# Patient Record
Sex: Female | Born: 1950 | Race: White | Hispanic: No | Marital: Married | Smoking: Former smoker
Health system: Southern US, Community
[De-identification: ages and names within clinical notes are randomized; demographics above are authoritative.]

## PROBLEM LIST (undated history)

## (undated) DIAGNOSIS — E119 Type 2 diabetes mellitus without complications: Secondary | ICD-10-CM

## (undated) DIAGNOSIS — I7300 Raynaud's syndrome without gangrene: Secondary | ICD-10-CM

## (undated) DIAGNOSIS — K589 Irritable bowel syndrome without diarrhea: Secondary | ICD-10-CM

## (undated) DIAGNOSIS — R112 Nausea with vomiting, unspecified: Secondary | ICD-10-CM

## (undated) DIAGNOSIS — G8929 Other chronic pain: Secondary | ICD-10-CM

## (undated) DIAGNOSIS — M1990 Unspecified osteoarthritis, unspecified site: Secondary | ICD-10-CM

## (undated) DIAGNOSIS — M549 Dorsalgia, unspecified: Secondary | ICD-10-CM

## (undated) DIAGNOSIS — I10 Essential (primary) hypertension: Secondary | ICD-10-CM

## (undated) DIAGNOSIS — M797 Fibromyalgia: Secondary | ICD-10-CM

## (undated) DIAGNOSIS — Z9889 Other specified postprocedural states: Secondary | ICD-10-CM

## (undated) DIAGNOSIS — T8859XA Other complications of anesthesia, initial encounter: Secondary | ICD-10-CM

## (undated) DIAGNOSIS — IMO0002 Reserved for concepts with insufficient information to code with codable children: Secondary | ICD-10-CM

## (undated) DIAGNOSIS — K5792 Diverticulitis of intestine, part unspecified, without perforation or abscess without bleeding: Secondary | ICD-10-CM

## (undated) DIAGNOSIS — R41840 Attention and concentration deficit: Secondary | ICD-10-CM

## (undated) DIAGNOSIS — T4145XA Adverse effect of unspecified anesthetic, initial encounter: Secondary | ICD-10-CM

## (undated) HISTORY — DX: Dorsalgia, unspecified: M54.9

## (undated) HISTORY — DX: Type 2 diabetes mellitus without complications: E11.9

## (undated) HISTORY — PX: TUBAL LIGATION: SHX77

## (undated) HISTORY — DX: Attention and concentration deficit: R41.840

## (undated) HISTORY — DX: Other chronic pain: G89.29

## (undated) HISTORY — DX: Diverticulitis of intestine, part unspecified, without perforation or abscess without bleeding: K57.92

## (undated) HISTORY — DX: Irritable bowel syndrome, unspecified: K58.9

## (undated) HISTORY — DX: Raynaud's syndrome without gangrene: I73.00

---

## 1999-11-20 ENCOUNTER — Other Ambulatory Visit: Admission: RE | Admit: 1999-11-20 | Discharge: 1999-11-20 | Payer: Self-pay | Admitting: Obstetrics and Gynecology

## 2000-01-06 HISTORY — PX: OOPHORECTOMY: SHX86

## 2001-06-10 ENCOUNTER — Emergency Department (HOSPITAL_COMMUNITY): Admission: EM | Admit: 2001-06-10 | Discharge: 2001-06-10 | Payer: Self-pay | Admitting: *Deleted

## 2003-05-16 ENCOUNTER — Ambulatory Visit (HOSPITAL_COMMUNITY): Admission: RE | Admit: 2003-05-16 | Discharge: 2003-05-16 | Payer: Self-pay | Admitting: Internal Medicine

## 2003-07-13 ENCOUNTER — Ambulatory Visit (HOSPITAL_COMMUNITY): Admission: RE | Admit: 2003-07-13 | Discharge: 2003-07-13 | Payer: Self-pay | Admitting: Family Medicine

## 2005-04-17 ENCOUNTER — Ambulatory Visit: Payer: Self-pay | Admitting: Internal Medicine

## 2006-01-19 ENCOUNTER — Encounter: Payer: Self-pay | Admitting: Internal Medicine

## 2006-01-19 DIAGNOSIS — Z860100 Personal history of colon polyps, unspecified: Secondary | ICD-10-CM | POA: Insufficient documentation

## 2006-01-19 DIAGNOSIS — Z8719 Personal history of other diseases of the digestive system: Secondary | ICD-10-CM | POA: Insufficient documentation

## 2006-01-19 DIAGNOSIS — K589 Irritable bowel syndrome without diarrhea: Secondary | ICD-10-CM | POA: Insufficient documentation

## 2006-01-19 DIAGNOSIS — I10 Essential (primary) hypertension: Secondary | ICD-10-CM | POA: Insufficient documentation

## 2006-01-19 DIAGNOSIS — M1990 Unspecified osteoarthritis, unspecified site: Secondary | ICD-10-CM | POA: Insufficient documentation

## 2006-01-19 DIAGNOSIS — F988 Other specified behavioral and emotional disorders with onset usually occurring in childhood and adolescence: Secondary | ICD-10-CM | POA: Insufficient documentation

## 2006-01-19 DIAGNOSIS — G43909 Migraine, unspecified, not intractable, without status migrainosus: Secondary | ICD-10-CM | POA: Insufficient documentation

## 2006-10-12 ENCOUNTER — Encounter (INDEPENDENT_AMBULATORY_CARE_PROVIDER_SITE_OTHER): Payer: Self-pay | Admitting: Internal Medicine

## 2008-10-29 ENCOUNTER — Encounter (INDEPENDENT_AMBULATORY_CARE_PROVIDER_SITE_OTHER): Payer: Self-pay | Admitting: Internal Medicine

## 2010-01-26 ENCOUNTER — Encounter: Payer: Self-pay | Admitting: Obstetrics and Gynecology

## 2010-01-26 ENCOUNTER — Encounter: Payer: Self-pay | Admitting: Family Medicine

## 2010-01-26 ENCOUNTER — Encounter: Payer: Self-pay | Admitting: Internal Medicine

## 2011-02-19 DIAGNOSIS — R0602 Shortness of breath: Secondary | ICD-10-CM | POA: Insufficient documentation

## 2011-02-19 DIAGNOSIS — E119 Type 2 diabetes mellitus without complications: Secondary | ICD-10-CM | POA: Insufficient documentation

## 2011-02-19 DIAGNOSIS — R002 Palpitations: Secondary | ICD-10-CM | POA: Insufficient documentation

## 2011-02-19 DIAGNOSIS — R079 Chest pain, unspecified: Secondary | ICD-10-CM | POA: Insufficient documentation

## 2011-02-19 DIAGNOSIS — R5383 Other fatigue: Secondary | ICD-10-CM | POA: Insufficient documentation

## 2011-03-03 ENCOUNTER — Other Ambulatory Visit (HOSPITAL_COMMUNITY): Payer: Self-pay | Admitting: Internal Medicine

## 2011-03-03 DIAGNOSIS — R109 Unspecified abdominal pain: Secondary | ICD-10-CM

## 2011-03-04 ENCOUNTER — Ambulatory Visit (HOSPITAL_COMMUNITY)
Admission: RE | Admit: 2011-03-04 | Discharge: 2011-03-04 | Disposition: A | Discharge status: Home / Self Care | Payer: Medicare Other | Source: Ambulatory Visit | Attending: Internal Medicine | Admitting: Internal Medicine

## 2011-03-04 DIAGNOSIS — R109 Unspecified abdominal pain: Secondary | ICD-10-CM | POA: Insufficient documentation

## 2011-03-06 ENCOUNTER — Encounter (HOSPITAL_COMMUNITY): Payer: Self-pay | Admitting: *Deleted

## 2011-03-06 ENCOUNTER — Emergency Department (HOSPITAL_COMMUNITY): Payer: Medicare Other

## 2011-03-06 ENCOUNTER — Emergency Department (HOSPITAL_COMMUNITY)
Admission: EM | Admit: 2011-03-06 | Discharge: 2011-03-06 | Disposition: A | Discharge status: Home / Self Care | Payer: Medicare Other | Attending: Emergency Medicine | Admitting: Emergency Medicine

## 2011-03-06 DIAGNOSIS — R634 Abnormal weight loss: Secondary | ICD-10-CM | POA: Insufficient documentation

## 2011-03-06 DIAGNOSIS — H53149 Visual discomfort, unspecified: Secondary | ICD-10-CM | POA: Insufficient documentation

## 2011-03-06 DIAGNOSIS — R5383 Other fatigue: Secondary | ICD-10-CM

## 2011-03-06 DIAGNOSIS — R5381 Other malaise: Secondary | ICD-10-CM | POA: Insufficient documentation

## 2011-03-06 DIAGNOSIS — I10 Essential (primary) hypertension: Secondary | ICD-10-CM | POA: Insufficient documentation

## 2011-03-06 DIAGNOSIS — Z79899 Other long term (current) drug therapy: Secondary | ICD-10-CM | POA: Insufficient documentation

## 2011-03-06 DIAGNOSIS — M810 Age-related osteoporosis without current pathological fracture: Secondary | ICD-10-CM | POA: Insufficient documentation

## 2011-03-06 DIAGNOSIS — Z7982 Long term (current) use of aspirin: Secondary | ICD-10-CM | POA: Insufficient documentation

## 2011-03-06 DIAGNOSIS — R51 Headache: Secondary | ICD-10-CM | POA: Insufficient documentation

## 2011-03-06 DIAGNOSIS — M129 Arthropathy, unspecified: Secondary | ICD-10-CM | POA: Insufficient documentation

## 2011-03-06 DIAGNOSIS — R630 Anorexia: Secondary | ICD-10-CM | POA: Insufficient documentation

## 2011-03-06 DIAGNOSIS — R110 Nausea: Secondary | ICD-10-CM | POA: Insufficient documentation

## 2011-03-06 HISTORY — DX: Unspecified osteoarthritis, unspecified site: M19.90

## 2011-03-06 HISTORY — DX: Reserved for concepts with insufficient information to code with codable children: IMO0002

## 2011-03-06 HISTORY — DX: Fibromyalgia: M79.7

## 2011-03-06 HISTORY — DX: Essential (primary) hypertension: I10

## 2011-03-06 LAB — BLOOD GAS, ARTERIAL
Acid-Base Excess: 1.2 mmol/L (ref 0.0–2.0)
TCO2: 21.6 mmol/L (ref 0–100)
pCO2 arterial: 36.3 mmHg (ref 35.0–45.0)
pO2, Arterial: 87.9 mmHg (ref 80.0–100.0)

## 2011-03-06 LAB — COMPREHENSIVE METABOLIC PANEL
Albumin: 3.9 g/dL (ref 3.5–5.2)
Alkaline Phosphatase: 87 U/L (ref 39–117)
BUN: 12 mg/dL (ref 6–23)
Chloride: 100 mEq/L (ref 96–112)
GFR calc Af Amer: >90 mL/min (ref >90)
Glucose, Bld: 141 mg/dL — ABNORMAL HIGH (ref 70–99)
Potassium: 3.8 mEq/L (ref 3.5–5.1)
Total Bilirubin: 0.8 mg/dL (ref 0.3–1.2)

## 2011-03-06 LAB — DIFFERENTIAL
Lymphs Abs: 1.4 10*3/uL (ref 0.7–4.0)
Monocytes Relative: 8 % (ref 3–12)
Neutro Abs: 4.7 10*3/uL (ref 1.7–7.7)
Neutrophils Relative %: 70 % (ref 43–77)

## 2011-03-06 LAB — URINALYSIS, ROUTINE W REFLEX MICROSCOPIC
Bilirubin Urine: NEGATIVE
Glucose, UA: NEGATIVE mg/dL
Ketones, ur: NEGATIVE mg/dL
Leukocytes, UA: NEGATIVE
Protein, ur: NEGATIVE mg/dL

## 2011-03-06 LAB — CBC
Hemoglobin: 14.3 g/dL (ref 12.0–15.0)
RBC: 4.46 MIL/uL (ref 3.87–5.11)
WBC: 6.7 10*3/uL (ref 4.0–10.5)

## 2011-03-06 LAB — D-DIMER, QUANTITATIVE: D-Dimer, Quant: 0.33 ug/mL-FEU (ref 0.00–0.48)

## 2011-03-06 LAB — TROPONIN I: Troponin I: <0.3 ng/mL (ref <0.30)

## 2011-03-06 MED ORDER — SODIUM CHLORIDE 0.9 % IV SOLN: INTRAVENOUS | Status: DC

## 2011-03-06 MED ORDER — SODIUM CHLORIDE 0.9 % IV BOLUS (SEPSIS)
1000.0000 mL | Freq: Once | INTRAVENOUS | Status: AC
  Administered 2011-03-06: 1000 mL via INTRAVENOUS

## 2011-03-06 MED ORDER — ONDANSETRON HCL 4 MG/2ML IJ SOLN
4.0000 mg | Freq: Once | INTRAMUSCULAR | Status: AC
  Administered 2011-03-06: 4 mg via INTRAVENOUS
  Filled 2011-03-06: qty 2

## 2011-03-06 NOTE — ED Notes (Signed)
Pt brought to er by husband to eval nausea that she has been having for months.  Pt also had shallow breathing yesterday, husband reported while he was a work.  Called pmd today to notify about symptoms she was having yesterday and was told to come to er for eval.  Pt stated the her breathing is better today.  Also loosing weight over the last few months.   Pt in dark room, with washcloth over face/eyes

## 2011-03-06 NOTE — ED Notes (Signed)
md to see pt, orders obtained.

## 2011-03-06 NOTE — ED Notes (Signed)
Nausea, unable  To eat,  Being evaluated by Dr Felecia Shelling for possible MS or Parkinsons.  Husband says shallow breathing and feels she is stopping breathing at times. Talking at triage.

## 2011-03-06 NOTE — ED Provider Notes (Addendum)
History     CSN: [id number]  Arrival date & time 03/06/11  1154   First MD Initiated Contact with Patient 03/06/11 1311      Chief Complaint  Patient presents with  . Nausea    (Consider location/radiation/quality/duration/timing/severity/associated sxs/prior treatment) Patient is a 61 y.o. female presenting with weakness. The history is provided by the patient, the spouse and medical records.  Weakness The primary symptoms include headaches and nausea. Primary symptoms do not include syncope, loss of consciousness, altered mental status, seizures, dizziness, visual change, paresthesias, focal weakness, loss of sensation, speech change, memory loss, fever or vomiting. Primary symptoms comment: loss of appetite resulting in decreased intake of food over the last 5-6 weeks with subsequent loss of weight to approximately 15 pounds in the last 2 weeks. The patient has a very flat affect and reports a significant lack of energy.  The symptoms began more than 1 week ago (7 weeks ago). The symptoms are unchanged. The neurological symptoms are diffuse.  The headache began more than 2 days ago. The headache developed gradually. Headache is a recurrent problem. The headache is present frequently. The pain from the headache is at a severity of 4/10. Location/region(s) of the headache: bilateral and frontal. Associated with: nothing. The headache is associated with photophobia and weakness. The headache is not associated with aura, eye pain, visual change, neck stiffness, paresthesias or loss of balance.  Additional symptoms include weakness, photophobia and dysphoric mood. Additional symptoms do not include neck stiffness, loss of balance, aura, hallucinations, hearing loss or tinnitus.    Past Medical History  Diagnosis Date  . Hypertension   . Arthritis   . Osteoporosis   . Fibromyalgia   . Disc degeneration     Past Surgical History  Procedure Date  . Oophorectomy     History reviewed. No  pertinent family history.  History  Substance Use Topics  . Smoking status: Current Some Day Smoker  . Smokeless tobacco: Not on file  . Alcohol Use: No    OB History    Grav Para Term Preterm Abortions TAB SAB Ect Mult Living                  Review of Systems  Constitutional: Positive for chills, activity change, appetite change, fatigue and unexpected weight change. Negative for fever and diaphoresis.  HENT: Negative for hearing loss, ear pain, nosebleeds, congestion, sore throat, facial swelling, rhinorrhea, trouble swallowing, neck pain, neck stiffness, tinnitus and ear discharge.   Eyes: Positive for photophobia. Negative for pain, discharge, redness, itching and visual disturbance.  Respiratory: Positive for shortness of breath. Negative for cough, chest tightness, wheezing and stridor.   Cardiovascular: Negative for chest pain, palpitations, leg swelling and syncope.  Gastrointestinal: Positive for nausea. Negative for vomiting, abdominal pain, diarrhea, blood in stool and abdominal distention.  Genitourinary: Negative for dysuria, frequency, hematuria, flank pain, decreased urine volume and difficulty urinating.  Musculoskeletal: Negative for myalgias, arthralgias and gait problem.  Skin: Negative for color change, pallor, rash and wound.  Neurological: Positive for weakness and headaches. Negative for dizziness, tremors, speech change, focal weakness, seizures, loss of consciousness, syncope, facial asymmetry, speech difficulty, light-headedness, numbness, paresthesias and loss of balance.  Psychiatric/Behavioral: Positive for dysphoric mood. Negative for hallucinations, memory loss, confusion and altered mental status. The patient is not nervous/anxious.     Allergies  Penicillins; Sulfa drugs cross reactors; Codeine; Levofloxacin; and Prednisone  Home Medications   Current Outpatient Rx  Name Route Sig Dispense  Refill  . AMPHETAMINE-DEXTROAMPHETAMINE 5 MG PO TABS Oral  Take 5 mg by mouth 4 (four) times daily as needed. Take 1 tablet in the morning and 1 tablet at lunch.  Take another tablets only if needed  For concentration    . ASPIRIN EC 325 MG PO TBEC Oral Take 325 mg by mouth daily as needed. For spike in blood pressure    . CYANOCOBALAMIN 1000 MCG/ML IJ SOLN Intramuscular Inject 1,000 mcg into the muscle every 30 (thirty) days.    Marland Kitchen LIDOCAINE 5 % EX PTCH Transdermal Place 1 patch onto the skin daily as needed. Remove & Discard patch within 12 hours or as directed by MD For fibromyalgia    . LOSARTAN POTASSIUM 50 MG PO TABS Oral Take 50 mg by mouth at bedtime.    . ADULT MULTIVITAMIN W/MINERALS CH Oral Take 1 tablet by mouth daily.    Marland Kitchen BOOST HIGH PROTEIN PO LIQD Oral Take 237 mLs by mouth 3 (three) times daily with meals as needed. Drinks as much as possible for calorie intake    . ONDANSETRON HCL 4 MG PO TABS Oral Take 4 mg by mouth every 6 (six) hours as needed. For nausea    . RIZATRIPTAN BENZOATE 10 MG PO TABS Oral Take 10 mg by mouth daily as needed. May repeat in 2 hours if needed      BP 130/79  Pulse 74  Temp(Src) 98.3 F (36.8 C) (Oral)  Resp 16  Ht 5' 3.5" (1.613 m)  Wt 123 lb (55.792 kg)  BMI 21.45 kg/m2  SpO2 100%  Physical Exam  Nursing note and vitals reviewed. Constitutional: She is oriented to person, place, and time. She appears well-developed and well-nourished. No distress.  HENT:  Head: Normocephalic and atraumatic.  Right Ear: External ear normal.  Left Ear: External ear normal.  Nose: Nose normal.  Mouth/Throat: Oropharynx is clear and moist. No oropharyngeal exudate.  Eyes: Conjunctivae and EOM are normal. Pupils are equal, round, and reactive to light. No scleral icterus.  Neck: Normal range of motion. Neck supple. No JVD present.  Cardiovascular: Normal rate, regular rhythm, normal heart sounds and intact distal pulses.  Exam reveals no gallop and no friction rub.   No murmur heard. Pulmonary/Chest: Effort normal  and breath sounds normal. No respiratory distress. She has no wheezes. She has no rales. She exhibits no tenderness.  Abdominal: Soft. Bowel sounds are normal. She exhibits no distension and no mass. There is no tenderness. There is no rebound and no guarding.  Musculoskeletal: Normal range of motion. She exhibits no edema and no tenderness.  Lymphadenopathy:    She has no cervical adenopathy.  Neurological: She is alert and oriented to person, place, and time. She has normal reflexes. She displays normal reflexes. No cranial nerve deficit. She exhibits normal muscle tone. Coordination normal.  Skin: Skin is warm and dry. No rash noted. She is not diaphoretic. No erythema. No pallor.  Psychiatric: Judgment and thought content normal.       Flat affect    ED Course  Procedures (including critical care time)   Labs Reviewed  BLOOD GAS, ARTERIAL  D-DIMER, QUANTITATIVE  TROPONIN I  CBC  DIFFERENTIAL  COMPREHENSIVE METABOLIC PANEL  URINALYSIS, ROUTINE W REFLEX MICROSCOPIC   US Renal  03/04/2011  *RADIOLOGY REPORT*  Clinical Data: Left flank pain  RENAL/URINARY TRACT ULTRASOUND COMPLETE  Comparison:  None.  Findings:  Right Kidney:  Right kidney measures 8.6 cm in length.  No hydronephrosis  or diagnostic renal calculus.  Left Kidney:  Measures 10.4 cm in length.  No hydronephrosis.  At least two or three small echogenic foci are noted in mid pole the largest measures 3 mm.  This probably represent nonobstructive calcifications.  Bladder:  Unremarkable.  IMPRESSION:  1.  Bilateral no hydronephrosis is noted. 2.  Small echogenic foci are noted mid pole of the left kidney the largest measures about 3 mm.  This are suspicious for nonobstructive calculi.  Original Report Authenticated By: Natasha Mead, M.D.     No diagnosis found.    MDM  The patient has presented for very vague symptoms, not suggestive of any one diagnosis in particular. To summarize, she reports a mild headache, photophobia,  nausea, no neck pain or rash, no fever, occasional chills, feeling of generalized weakness and fatigue with decreased energy, decreased appetite, decreased oral intake, weight loss, and without altered mental status or confusion. She is breathing easily and in no respiratory distress with adequate oxygen saturations on room air. She denies chest pain or palpitations. The patient has a very flat affect, and I strongly suspect depression as part of the clinical picture. I do not suspect myocardial infarction, or pneumonia based on history or physical exam. I will obtain an ECG to exclude arrhythmia and a d-dimer to evaluate for the possibility of pulmonary embolism. I will assess for anemia, electrolyte abnormalities, hepatic abnormalities, urinary tract infection, and in the meantime I will give her IV fluids for mild dehydration. I have ordered an ABG did demonstrate hopefully that her ventilation status is normal given that she complains of shallow breathing due to weakness. I have called and discussed the patient's care with Dr. Felecia Shelling, her primary care physician, and she recommends outpatient followup, neurology followup.  Felisa Bonier, MD 03/06/11 1448  3:39 PM At this time I have reviewed all the patient's laboratory and radiographic studies which are all normal. Her vital signs are stable and she appears fatigued but in no acute distress. I called and spoke with her primary care physician, Dr. Felecia Shelling and he recommends outpatient followup with him and with neurology. There is no evident need for or criteria for admission at this time.  Felisa Bonier, MD 03/06/11 1540

## 2011-03-16 ENCOUNTER — Other Ambulatory Visit: Payer: Self-pay | Admitting: Neurology

## 2011-03-16 DIAGNOSIS — R4182 Altered mental status, unspecified: Secondary | ICD-10-CM

## 2011-03-17 ENCOUNTER — Telehealth: Payer: Self-pay | Admitting: Internal Medicine

## 2011-03-17 NOTE — Telephone Encounter (Signed)
Pt's husband called to make OV for his wife. He is aware of OV on 3/18 at 230 with KJ and he wants procedure scheduled on 3/29 with [facility]. I told him that her procedure would be scheduled on day of her OV, but he wants her procedure done on a Friday by [facility]. I told him that I wasn't familiar with R[facility]'s surgery schedule, but I would let the scheduler know. He would like a return call or we can leave him a detailed message at [phone]. Pt's husband is a Emergency planning/management officer in [place] and there will be a conflict in his work schedule.

## 2011-03-17 NOTE — Telephone Encounter (Signed)
Called, LMOVM for Sheryl Parker to call me back

## 2011-03-18 ENCOUNTER — Other Ambulatory Visit (HOSPITAL_COMMUNITY): Payer: Medicare Other

## 2011-03-18 NOTE — Telephone Encounter (Signed)
Spoke with pts husband- we will pick a day for procedure after her OV

## 2011-03-19 ENCOUNTER — Ambulatory Visit (HOSPITAL_COMMUNITY)
Admission: RE | Admit: 2011-03-19 | Discharge: 2011-03-19 | Disposition: A | Discharge status: Home / Self Care | Payer: Medicare Other | Source: Ambulatory Visit | Attending: Neurology | Admitting: Neurology

## 2011-03-19 DIAGNOSIS — H538 Other visual disturbances: Secondary | ICD-10-CM | POA: Insufficient documentation

## 2011-03-19 DIAGNOSIS — R4182 Altered mental status, unspecified: Secondary | ICD-10-CM | POA: Insufficient documentation

## 2011-03-19 DIAGNOSIS — R209 Unspecified disturbances of skin sensation: Secondary | ICD-10-CM | POA: Insufficient documentation

## 2011-03-19 DIAGNOSIS — R51 Headache: Secondary | ICD-10-CM | POA: Insufficient documentation

## 2011-03-23 ENCOUNTER — Ambulatory Visit (INDEPENDENT_AMBULATORY_CARE_PROVIDER_SITE_OTHER): Payer: Medicare Other | Admitting: Urgent Care

## 2011-03-23 ENCOUNTER — Encounter: Payer: Self-pay | Admitting: Urgent Care

## 2011-03-23 VITALS — BP 144/93 | HR 66 | Temp 97.4°F | Ht 63.0 in | Wt 130.0 lb

## 2011-03-23 DIAGNOSIS — R634 Abnormal weight loss: Secondary | ICD-10-CM | POA: Insufficient documentation

## 2011-03-23 DIAGNOSIS — R110 Nausea: Secondary | ICD-10-CM

## 2011-03-23 NOTE — Assessment & Plan Note (Addendum)
Sheryl Parker is a pleasant 61 y.o. female with 2 month history of chronic nausea and very little vomiting. Her nausea is persistent 24 hours a day. She is currently undergoing workup to rule out multiple sclerosis by neurology. She is going to need EGD for further evaluation to rule out peptic ulcer disease or gastritis. Symptoms will be atypical for gastroparesis given the limited vomiting, however it should remain in the differential.  EGD with Dr. Jena Gauss.  I have discussed risks & benefits which include, but are not limited to, bleeding, infection, perforation & drug reaction.  The patient agrees with this plan & written consent will be obtained.    Zofran 4 mg every 8 hours when necessary. Continue Protonix 40 mg daily Be sure to followup with your neurologist as planned

## 2011-03-23 NOTE — Progress Notes (Signed)
Referring Provider: Avon Gully, MD Primary Care Physician:  Avon Gully, MD, MD Primary Gastroenterologist:  Dr. Jena Gauss  Chief Complaint  Patient presents with  . Nausea    all the time/lost 20 + lb in 2 months  . Emesis  . Dysphagia   HPI:  Sheryl Parker is a 61 y.o. female here as a referral from Dr. Felecia Shelling for chronic nausea x 2 months.   Very little in the way of emesis.She has been having left upper quad pain x 2 mo that is intermittent, feels like "swelling" 10/10 @ worst, now 0/10, lasts several days.  C/o nausea 24 hrs/day for the past 2 months.  Denies headache, except occasional migraines.  Denies fever.  +chills.  BM every day QOD.  No rectal bleeding or melena.  Weight loss 20# in 2 months.  Eating very little, only tuna fish, eggs, & yogurt.  Eats nothing for days at a time 2 to nausea.  No new medications except started protonix 40mg  daily 1 week ago.  Denies heartburn or indigestion.  Some regurgiation,  Feels like food stuck in upper esophagus,  No problems w/ liquids.  No Odynophagia.  She reports being evaluated for MS.  Sent to Neurology.    Recent ER evaluation.  MRA 03/19/11->1.  Mild pericallosal white matter disease as described.  This is nonspecific, but could be seen in the setting of a demyelinating Process.  2.  Additional white matter changes are present within the central pons. 3.  No other acute or focal abnormality to explain the patients symptoms. Recent Results (from the past 504 hour(s))  URINALYSIS, ROUTINE W REFLEX MICROSCOPIC   Collection Time   03/06/11  2:08 PM      Component Value Range   Color, Urine YELLOW  YELLOW    APPearance CLEAR  CLEAR    Specific Gravity, Urine 1.010  1.005 - 1.030    pH 6.5  5.0 - 8.0    Glucose, UA NEGATIVE  NEGATIVE (mg/dL)   Hgb urine dipstick NEGATIVE  NEGATIVE    Bilirubin Urine NEGATIVE  NEGATIVE    Ketones, ur NEGATIVE  NEGATIVE (mg/dL)   Protein, ur NEGATIVE  NEGATIVE (mg/dL)   Urobilinogen, UA 0.2  0.0 - 1.0  (mg/dL)   Nitrite NEGATIVE  NEGATIVE    Leukocytes, UA NEGATIVE  NEGATIVE   D-DIMER, QUANTITATIVE   Collection Time   03/06/11  2:29 PM      Component Value Range   D-Dimer, Quant 0.33  0.00 - 0.48 (ug/mL-FEU)  TROPONIN I   Collection Time   03/06/11  2:29 PM      Component Value Range   Troponin I <0.30  <0.30 (ng/mL)  CBC   Collection Time   03/06/11  2:29 PM      Component Value Range   WBC 6.7  4.0 - 10.5 (K/uL)   RBC 4.46  3.87 - 5.11 (MIL/uL)   Hemoglobin 14.3  12.0 - 15.0 (g/dL)   HCT 45.4  09.8 - 11.9 (%)   MCV 93.0  78.0 - 100.0 (fL)   MCH 32.1  26.0 - 34.0 (pg)   MCHC 34.5  30.0 - 36.0 (g/dL)   RDW 14.7  82.9 - 56.2 (%)   Platelets 296  150 - 400 (K/uL)  DIFFERENTIAL   Collection Time   03/06/11  2:29 PM      Component Value Range   Neutrophils Relative 70  43 - 77 (%)   Neutro Abs 4.7  1.7 - 7.7 (  K/uL)   Lymphocytes Relative 21  12 - 46 (%)   Lymphs Abs 1.4  0.7 - 4.0 (K/uL)   Monocytes Relative 8  3 - 12 (%)   Monocytes Absolute 0.5  0.1 - 1.0 (K/uL)   Eosinophils Relative 2  0 - 5 (%)   Eosinophils Absolute 0.1  0.0 - 0.7 (K/uL)   Basophils Relative 0  0 - 1 (%)   Basophils Absolute 0.0  0.0 - 0.1 (K/uL)  COMPREHENSIVE METABOLIC PANEL   Collection Time   03/06/11  2:29 PM      Component Value Range   Sodium 139  135 - 145 (mEq/L)   Potassium 3.8  3.5 - 5.1 (mEq/L)   Chloride 100  96 - 112 (mEq/L)   CO2 28  19 - 32 (mEq/L)   Glucose, Bld 141 (*) 70 - 99 (mg/dL)   BUN 12  6 - 23 (mg/dL)   Creatinine, Ser 1.61  0.50 - 1.10 (mg/dL)   Calcium 09.6  8.4 - 10.5 (mg/dL)   Total Protein 7.3  6.0 - 8.3 (g/dL)   Albumin 3.9  3.5 - 5.2 (g/dL)   AST 19  0 - 37 (U/L)   ALT 14  0 - 35 (U/L)   Alkaline Phosphatase 87  39 - 117 (U/L)   Total Bilirubin 0.8  0.3 - 1.2 (mg/dL)   GFR calc non Af Amer 89 (*) >90 (mL/min)   GFR calc Af Amer >90  >90 (mL/min)  BLOOD GAS, ARTERIAL   Collection Time   03/06/11  2:30 PM      Component Value Range   FIO2 0.21     pH, Arterial  7.449 (*) 7.350 - 7.400    pCO2 arterial 36.3  35.0 - 45.0 (mmHg)   pO2, Arterial 87.9  80.0 - 100.0 (mmHg)   Bicarbonate 24.8 (*) 20.0 - 24.0 (mEq/L)   TCO2 21.6  0 - 100 (mmol/L)   Acid-Base Excess 1.2  0.0 - 2.0 (mmol/L)   O2 Saturation 96.9     Patient temperature 37.0     Collection site RIGHT BRACHIAL     Drawn by COLLECTED BY RT     Sample type ARTERIAL     Allens test (pass/fail) NOT INDICATED (*) PASS    Past Medical History  Diagnosis Date  . Hypertension   . Arthritis   . Osteoporosis   . Fibromyalgia   . Disc degeneration   . Attention deficit     Dr. Misty Stanley Peloski-Gregory  . Raynaud disease   . Chronic back pain     hx chronic narcotics- Dr in [place] recent  meds since 2 yrs ago  . Migraine   . IBS (irritable bowel syndrome)   . Diverticulitis     Past Surgical History  Procedure Date  . Oophorectomy 2002    right  . Tubal ligation     Current Outpatient Prescriptions  Medication Sig Dispense Refill  . amphetamine-dextroamphetamine (ADDERALL) 5 MG tablet Take 5 mg by mouth 4 (four) times daily as needed. Take 1 tablet in the morning and 1 tablet at lunch.  Take another tablets only if needed  For concentration      . aspirin EC 325 MG tablet Take 325 mg by mouth daily as needed. For spike in blood pressure      . lidocaine (LIDODERM) 5 % Place 1 patch onto the skin daily as needed. Remove & Discard patch within 12 hours or as directed by MD For  fibromyalgia      . losartan (COZAAR) 50 MG tablet Take 50 mg by mouth at bedtime.      . Multiple Vitamin (MULITIVITAMIN WITH MINERALS) TABS Take 1 tablet by mouth daily.      . nutritional supplement (BOOST HIGH PROTEIN) LIQD Take 237 mLs by mouth 3 (three) times daily with meals as needed. Drinks as much as possible for calorie intake      . ondansetron (ZOFRAN) 4 MG tablet Take 4 mg by mouth every 6 (six) hours as needed. For nausea      . pantoprazole (PROTONIX) 40 MG tablet Take 40 mg by mouth  daily.      . promethazine (PHENERGAN) 25 MG tablet Take 25 mg by mouth every 8 (eight) hours as needed.      . rizatriptan (MAXALT) 10 MG tablet Take 10 mg by mouth daily as needed. May repeat in 2 hours if needed        Allergies as of 03/23/2011 - Review Complete 03/23/2011  Allergen Reaction Noted  . Penicillins Anaphylaxis 01/19/2006  . Sulfa drugs cross reactors Anaphylaxis 03/06/2011  . Codeine Nausea And Vomiting 01/19/2006  . Levofloxacin Other (See Comments) 03/06/2011  . Prednisone Other (See Comments) 03/06/2011    Family History:There is no known family history of colorectal carcinoma.  Problem Relation Age of Onset  . Colon polyps Brother   . Cirrhosis Brother     etoh  . Breast cancer Sister   . Liver disease Son     Hepatitis C    History   Social History  . Marital Status: Married    Spouse Name: N/A    Number of Children: 1  . Years of Education: N/A   Occupational History  . DISABLED-Research Development    Social History Main Topics  . Smoking status: Current Some Day Smoker -- 0.2 packs/day for 8 years    Types: Cigarettes  . Smokeless tobacco: Not on file   Comment: 1-3 cigarettes  . Alcohol Use: Yes     HEAVY ETOH X 25 YRS, 8 YRS AGO  . Drug Use: No  . Sexually Active: Not on file   Other Topics Concern  . Not on file   Social History Narrative   1 SON-Hep C, polysubstance abuse   Review of Systems: Gen: c/o chills, sweats, anorexia, fatigue, weakness, malaise, weight loss, and sleep disorder CV: Denies chest pain, angina, palpitations, syncope, orthopnea, PND, peripheral edema, and claudication. Resp: Denies dyspnea at rest, dyspnea with exercise, cough, sputum, wheezing, coughing up blood, and pleurisy. GI: Denies vomiting blood, jaundice, and fecal incontinence.  GU : Denies urinary burning, blood in urine, urinary frequency, urinary hesitancy, nocturnal urination, and urinary incontinence. MS: Chronic back and muscle pain. With  history of fibromyalgia. Being evaluated for MS. Derm: Denies rash, itching, dry skin, hives, moles, warts, or unhealing ulcers.  Psych: Denies depression, anxiety, memory loss, suicidal ideation, hallucinations, paranoia, and confusion. Heme: Denies bruising, bleeding, and enlarged lymph nodes. Neuro:  Denies any dizziness or paresthesias. Occasional migraine headaches Endo:  Denies any problems with DM, thyroid, adrenal function.  Physical Exam: BP 144/93  Pulse 66  Temp(Src) 97.4 F (36.3 C) (Temporal)  Ht 5\' 3"  (1.6 m)  Wt 130 lb (58.968 kg)  BMI 23.03 kg/m2 General:   Alert,  Well-developed, well-nourished, pleasant and cooperative in NAD.  Accompanied by her husband today. Head:  Normocephalic and atraumatic. Eyes:  Sclera clear, no icterus.   Conjunctiva pink. Ears:  Normal auditory acuity. Nose:  No deformity, discharge, or lesions. Mouth:  No deformity or lesions,oropharynx pink & moist. Neck:  Supple; no masses or thyromegaly. Lungs:  Clear throughout to auscultation.   No wheezes, crackles, or rhonchi. No acute distress. Heart:  Regular rate and rhythm; no murmurs, clicks, rubs,  or gallops. Abdomen:  Multiple tattoos. Normal bowel sounds.  No bruits.  Soft, non-tender and non-distended without masses, hepatosplenomegaly or hernias noted.  No guarding or rebound tenderness.   Rectal:  Deferred until time of colonoscopy. Msk:  Symmetrical without gross deformities. Normal posture. Pulses:  Normal pulses noted. Extremities:  No clubbing or edema. Neurologic:  Alert and oriented x4;  grossly normal neurologically. Skin:  Intact without significant lesions or rashes. Lymph Nodes:  No significant cervical adenopathy. Psych:  Alert and cooperative. Flat affect.

## 2011-03-23 NOTE — Assessment & Plan Note (Signed)
Unintentional weight loss is concerning. Complete colonoscopy and EGD for further evaluation to rule out malignancy.  I have discussed risks & benefits which include, but are not limited to, bleeding, infection, perforation & drug reaction.  The patient agrees with this plan & written consent will be obtained.   She should have a TSH if this was not done elsewhere.

## 2011-03-23 NOTE — Patient Instructions (Signed)
You need a colonoscopy and EGD with Dr. Jena Gauss Continue Zofran 4 mg every 8 hours as needed for nausea Continue Protonix 40 mg daily Be sure to followup with your neurologist as planned  Nausea, Adult Nausea is the feeling that you have an upset stomach or have to vomit. Nausea by itself is not likely a serious concern, but it may be an early sign of more serious medical problems. As nausea gets worse, it can lead to vomiting. If vomiting develops, there is the risk of dehydration.  CAUSES   Viral infections.   Food poisoning.   Medicines.   Pregnancy.   Motion sickness.   Migraine headaches.   Emotional distress.   Severe pain from any source.   Alcohol intoxication.  HOME CARE INSTRUCTIONS  Get plenty of rest.   Ask your caregiver about specific rehydration instructions.   Eat small amounts of food and sip liquids more often.   Take all medicines as told by your caregiver.  SEEK MEDICAL CARE IF:  You have not improved after 2 days, or you get worse.   You have a headache.  SEEK IMMEDIATE MEDICAL CARE IF:   You have a fever.   You faint.   You keep vomiting or have blood in your vomit.   You are extremely weak or dehydrated.   You have dark or bloody stools.   You have severe chest or abdominal pain.  MAKE SURE YOU:  Understand these instructions.   Will watch your condition.   Will get help right away if you are not doing well or get worse.  Document Released: 01/30/2004 Document Revised: 12/11/2010 Document Reviewed: 09/03/2010 [facility] Patient Information 2012 [place], [place]. 1-800-quit-now for help quitting smoking

## 2011-03-24 NOTE — Progress Notes (Signed)
Faxed to PCP

## 2011-03-27 NOTE — Progress Notes (Signed)
Labs reviewed from 03/16/11 T4 and TSH normal (Dr. Felecia Shelling)

## 2011-04-21 NOTE — Progress Notes (Signed)
REVIEWED.  

## 2011-07-14 ENCOUNTER — Emergency Department (HOSPITAL_COMMUNITY)
Admission: EM | Admit: 2011-07-14 | Discharge: 2011-07-14 | Disposition: A | Discharge status: Home / Self Care | Payer: Medicare Other | Attending: Emergency Medicine | Admitting: Emergency Medicine

## 2011-07-14 ENCOUNTER — Encounter (HOSPITAL_COMMUNITY): Payer: Self-pay | Admitting: *Deleted

## 2011-07-14 DIAGNOSIS — IMO0001 Reserved for inherently not codable concepts without codable children: Secondary | ICD-10-CM | POA: Insufficient documentation

## 2011-07-14 DIAGNOSIS — Z803 Family history of malignant neoplasm of breast: Secondary | ICD-10-CM | POA: Insufficient documentation

## 2011-07-14 DIAGNOSIS — K589 Irritable bowel syndrome without diarrhea: Secondary | ICD-10-CM | POA: Insufficient documentation

## 2011-07-14 DIAGNOSIS — Z8371 Family history of colonic polyps: Secondary | ICD-10-CM | POA: Insufficient documentation

## 2011-07-14 DIAGNOSIS — Z83719 Family history of colon polyps, unspecified: Secondary | ICD-10-CM | POA: Insufficient documentation

## 2011-07-14 DIAGNOSIS — G8929 Other chronic pain: Secondary | ICD-10-CM | POA: Insufficient documentation

## 2011-07-14 DIAGNOSIS — M549 Dorsalgia, unspecified: Secondary | ICD-10-CM | POA: Insufficient documentation

## 2011-07-14 DIAGNOSIS — I10 Essential (primary) hypertension: Secondary | ICD-10-CM | POA: Insufficient documentation

## 2011-07-14 DIAGNOSIS — G43909 Migraine, unspecified, not intractable, without status migrainosus: Secondary | ICD-10-CM | POA: Insufficient documentation

## 2011-07-14 DIAGNOSIS — M129 Arthropathy, unspecified: Secondary | ICD-10-CM | POA: Insufficient documentation

## 2011-07-14 DIAGNOSIS — Z8349 Family history of other endocrine, nutritional and metabolic diseases: Secondary | ICD-10-CM | POA: Insufficient documentation

## 2011-07-14 DIAGNOSIS — F17200 Nicotine dependence, unspecified, uncomplicated: Secondary | ICD-10-CM | POA: Insufficient documentation

## 2011-07-14 DIAGNOSIS — M79609 Pain in unspecified limb: Secondary | ICD-10-CM | POA: Insufficient documentation

## 2011-07-14 DIAGNOSIS — I7300 Raynaud's syndrome without gangrene: Secondary | ICD-10-CM | POA: Insufficient documentation

## 2011-07-14 DIAGNOSIS — M810 Age-related osteoporosis without current pathological fracture: Secondary | ICD-10-CM | POA: Insufficient documentation

## 2011-07-14 MED ORDER — HYDROCODONE-ACETAMINOPHEN 5-325 MG PO TABS: 1.0000 | ORAL_TABLET | Freq: Four times a day (QID) | ORAL | Status: AC | PRN

## 2011-07-14 MED ORDER — HYDROCODONE-ACETAMINOPHEN 5-325 MG PO TABS
1.0000 | ORAL_TABLET | Freq: Once | ORAL | Status: AC
  Administered 2011-07-14: 1 via ORAL
  Filled 2011-07-14: qty 1

## 2011-07-14 NOTE — ED Provider Notes (Signed)
History     CSN: [id number]  Arrival date & time 07/14/11  1139   None     Chief Complaint  Patient presents with  . Back Pain  . Hand Pain    (Consider location/radiation/quality/duration/timing/severity/associated sxs/prior treatment) HPI Comments: Pt has chronic pain.  She stopped going to a pain management clinic several months ago "because i decided i would just live with the pain".  She can't tolerate the pain anymore and is planning to get re-established at the pain clinic.  Pt of dr. Felecia Shelling.  Patient is a 61 y.o. female presenting with back pain and hand pain. The history is provided by the patient. No language interpreter was used.  Back Pain  This is a chronic problem. The problem occurs constantly. The pain is associated with no known injury. The pain is present in the lumbar spine. The pain is severe. Pertinent negatives include no numbness and no weakness.  Hand Pain Pertinent negatives include no numbness or weakness.    Past Medical History  Diagnosis Date  . Hypertension   . Arthritis   . Osteoporosis   . Fibromyalgia   . Disc degeneration   . Attention deficit     Dr. Misty Stanley [place]  . Raynaud disease   . Chronic back pain     hx chronic narcotics- Dr in [place] recent  meds since 2 yrs ago  . Migraine   . IBS (irritable bowel syndrome)   . Diverticulitis     Past Surgical History  Procedure Date  . Oophorectomy 2002    right  . Tubal ligation     Family History  Problem Relation Age of Onset  . Colon polyps Brother   . Cirrhosis Brother     etoh  . Breast cancer Sister   . Liver disease Son     Hepatitis C    History  Substance Use Topics  . Smoking status: Current Some Day Smoker -- 0.2 packs/day for 8 years    Types: Cigarettes  . Smokeless tobacco: Not on file   Comment: 1-3 cigarettes  . Alcohol Use: Yes     HEAVY ETOH X 25 YRS, 8 YRS AGO    OB History    Grav Para Term Preterm Abortions TAB SAB Ect Mult  Living                  Review of Systems  Musculoskeletal: Positive for back pain.  Neurological: Negative for weakness and numbness.  All other systems reviewed and are negative.    Allergies  Penicillins; Sulfa drugs cross reactors; Codeine; Levofloxacin; and Prednisone  Home Medications   Current Outpatient Rx  Name Route Sig Dispense Refill  . AMPHETAMINE-DEXTROAMPHETAMINE 5 MG PO TABS Oral Take 5 mg by mouth 4 (four) times daily as needed. Take 1 tablet in the morning and 1 tablet at lunch.  Take another tablets only if needed  For concentration    . ASPIRIN EC 325 MG PO TBEC Oral Take 325 mg by mouth daily as needed. For spike in blood pressure    . HYDROCODONE-ACETAMINOPHEN 5-325 MG PO TABS Oral Take 1 tablet by mouth every 6 (six) hours as needed for pain. 20 tablet 0  . LIDOCAINE 5 % EX PTCH Transdermal Place 1 patch onto the skin daily as needed. Remove & Discard patch within 12 hours or as directed by MD For fibromyalgia    . LOSARTAN POTASSIUM 50 MG PO TABS Oral Take 50 mg by mouth at  bedtime.    . ADULT MULTIVITAMIN W/MINERALS CH Oral Take 1 tablet by mouth daily.    Marland Kitchen BOOST HIGH PROTEIN PO LIQD Oral Take 237 mLs by mouth 3 (three) times daily with meals as needed. Drinks as much as possible for calorie intake    . ONDANSETRON HCL 4 MG PO TABS Oral Take 4 mg by mouth every 6 (six) hours as needed. For nausea    . PANTOPRAZOLE SODIUM 40 MG PO TBEC Oral Take 40 mg by mouth daily.    Marland Kitchen PROMETHAZINE HCL 25 MG PO TABS Oral Take 25 mg by mouth every 8 (eight) hours as needed.    Marland Kitchen RIZATRIPTAN BENZOATE 10 MG PO TABS Oral Take 10 mg by mouth daily as needed. May repeat in 2 hours if needed      BP 175/102  Pulse 93  Temp 98.2 F (36.8 C) (Oral)  Resp 17  Ht 5' 3.5" (1.613 m)  Wt 130 lb (58.968 kg)  BMI 22.67 kg/m2  SpO2 100%  Physical Exam  Nursing note and vitals reviewed. Constitutional: She is oriented to person, place, and time. She appears well-developed and  well-nourished. No distress.  HENT:  Head: Normocephalic and atraumatic.  Eyes: EOM are normal.  Neck: Normal range of motion.  Cardiovascular: Normal rate, regular rhythm and normal heart sounds.   Pulmonary/Chest: Effort normal and breath sounds normal.  Abdominal: Soft. She exhibits no distension. There is no tenderness.  Musculoskeletal: She exhibits no tenderness.       Back:  Neurological: She is alert and oriented to person, place, and time.  Skin: Skin is warm and dry.  Psychiatric: She has a normal mood and affect. Judgment normal.    ED Course  Procedures (including critical care time)  Labs Reviewed - No data to display No results found.   1. Chronic back pain       MDM  rx-hydrocodone,  20        Evalina Field, [place] 07/14/11 1434
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
## 2011-07-14 NOTE — ED Provider Notes (Signed)
Medical screening examination/treatment/procedure(s) were performed by non-physician practitioner and as supervising physician I was immediately available for consultation/collaboration.  Donnetta Hutching, MD 07/14/11 [phone]

## 2011-09-09 ENCOUNTER — Emergency Department (HOSPITAL_COMMUNITY)
Admission: EM | Admit: 2011-09-09 | Discharge: 2011-09-09 | Disposition: A | Discharge status: Home / Self Care | Payer: Medicare Other | Attending: Emergency Medicine | Admitting: Emergency Medicine

## 2011-09-09 ENCOUNTER — Encounter (HOSPITAL_COMMUNITY): Payer: Self-pay | Admitting: *Deleted

## 2011-09-09 DIAGNOSIS — M549 Dorsalgia, unspecified: Secondary | ICD-10-CM | POA: Insufficient documentation

## 2011-09-09 DIAGNOSIS — K589 Irritable bowel syndrome without diarrhea: Secondary | ICD-10-CM | POA: Insufficient documentation

## 2011-09-09 DIAGNOSIS — I10 Essential (primary) hypertension: Secondary | ICD-10-CM | POA: Insufficient documentation

## 2011-09-09 DIAGNOSIS — I7300 Raynaud's syndrome without gangrene: Secondary | ICD-10-CM | POA: Insufficient documentation

## 2011-09-09 DIAGNOSIS — IMO0001 Reserved for inherently not codable concepts without codable children: Secondary | ICD-10-CM | POA: Insufficient documentation

## 2011-09-09 DIAGNOSIS — F988 Other specified behavioral and emotional disorders with onset usually occurring in childhood and adolescence: Secondary | ICD-10-CM | POA: Insufficient documentation

## 2011-09-09 DIAGNOSIS — M129 Arthropathy, unspecified: Secondary | ICD-10-CM | POA: Insufficient documentation

## 2011-09-09 DIAGNOSIS — G8929 Other chronic pain: Secondary | ICD-10-CM

## 2011-09-09 DIAGNOSIS — M810 Age-related osteoporosis without current pathological fracture: Secondary | ICD-10-CM | POA: Insufficient documentation

## 2011-09-09 MED ORDER — KETOROLAC TROMETHAMINE 60 MG/2ML IM SOLN
60.0000 mg | Freq: Once | INTRAMUSCULAR | Status: AC
  Administered 2011-09-09: 60 mg via INTRAMUSCULAR
  Filled 2011-09-09: qty 2

## 2011-09-09 MED ORDER — DIAZEPAM 5 MG/ML IJ SOLN
5.0000 mg | Freq: Once | INTRAMUSCULAR | Status: AC
  Administered 2011-09-09: 5 mg via INTRAVENOUS
  Filled 2011-09-09: qty 2

## 2011-09-09 MED ORDER — HYDROCODONE-ACETAMINOPHEN 5-325 MG PO TABS: 2.0000 | ORAL_TABLET | ORAL | Status: AC | PRN
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
## 2011-09-09 NOTE — ED Notes (Signed)
Pt was seen and assessed by Yvonna Alanis, EDPA.

## 2011-09-09 NOTE — ED Notes (Signed)
Pt reports lower back pain, has hx of bulging disc.  Pt states last night she slipped and almost fell, reports catching herself twisting her back.
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
## 2011-09-14 NOTE — ED Provider Notes (Signed)
Medical screening examination/treatment/procedure(s) were performed by non-physician practitioner and as supervising physician I was immediately available for consultation/collaboration.   Loren Racer, MD 09/14/11 [phone]

## 2011-11-05 ENCOUNTER — Other Ambulatory Visit (HOSPITAL_COMMUNITY): Payer: Self-pay | Admitting: Obstetrics & Gynecology

## 2011-11-05 DIAGNOSIS — Z139 Encounter for screening, unspecified: Secondary | ICD-10-CM

## 2011-11-10 ENCOUNTER — Ambulatory Visit (HOSPITAL_COMMUNITY): Payer: Medicare Other

## 2011-11-17 ENCOUNTER — Ambulatory Visit (HOSPITAL_COMMUNITY)
Admission: RE | Admit: 2011-11-17 | Discharge: 2011-11-17 | Disposition: A | Discharge status: Home / Self Care | Payer: Medicare Other | Source: Ambulatory Visit | Attending: Obstetrics & Gynecology | Admitting: Obstetrics & Gynecology

## 2011-11-17 DIAGNOSIS — Z139 Encounter for screening, unspecified: Secondary | ICD-10-CM

## 2011-11-17 DIAGNOSIS — Z1231 Encounter for screening mammogram for malignant neoplasm of breast: Secondary | ICD-10-CM | POA: Insufficient documentation

## 2011-11-26 ENCOUNTER — Other Ambulatory Visit: Payer: Self-pay | Admitting: Obstetrics & Gynecology

## 2011-11-26 DIAGNOSIS — R928 Other abnormal and inconclusive findings on diagnostic imaging of breast: Secondary | ICD-10-CM

## 2011-12-04 ENCOUNTER — Other Ambulatory Visit: Payer: Medicare Other

## 2011-12-17 ENCOUNTER — Ambulatory Visit
Admission: RE | Admit: 2011-12-17 | Discharge: 2011-12-17 | Disposition: A | Discharge status: Home / Self Care | Payer: Medicare Other | Source: Ambulatory Visit | Attending: Obstetrics & Gynecology | Admitting: Obstetrics & Gynecology

## 2011-12-17 DIAGNOSIS — R928 Other abnormal and inconclusive findings on diagnostic imaging of breast: Secondary | ICD-10-CM

## 2012-05-20 ENCOUNTER — Other Ambulatory Visit: Payer: Self-pay | Admitting: Obstetrics & Gynecology

## 2012-05-20 DIAGNOSIS — N6002 Solitary cyst of left breast: Secondary | ICD-10-CM

## 2012-06-14 ENCOUNTER — Ambulatory Visit
Admission: RE | Admit: 2012-06-14 | Discharge: 2012-06-14 | Disposition: A | Discharge status: Home / Self Care | Payer: Medicare Other | Source: Ambulatory Visit | Attending: Obstetrics & Gynecology | Admitting: Obstetrics & Gynecology

## 2012-06-14 DIAGNOSIS — N6002 Solitary cyst of left breast: Secondary | ICD-10-CM

## 2012-12-13 ENCOUNTER — Ambulatory Visit (HOSPITAL_COMMUNITY): Payer: Medicare Other | Admitting: Psychiatry

## 2012-12-28 ENCOUNTER — Ambulatory Visit (INDEPENDENT_AMBULATORY_CARE_PROVIDER_SITE_OTHER): Payer: Medicare Other | Admitting: Psychiatry

## 2012-12-28 ENCOUNTER — Encounter (HOSPITAL_COMMUNITY): Payer: Self-pay | Admitting: Psychiatry

## 2012-12-28 DIAGNOSIS — F4310 Post-traumatic stress disorder, unspecified: Secondary | ICD-10-CM | POA: Insufficient documentation

## 2012-12-28 DIAGNOSIS — F411 Generalized anxiety disorder: Secondary | ICD-10-CM

## 2012-12-28 DIAGNOSIS — F332 Major depressive disorder, recurrent severe without psychotic features: Secondary | ICD-10-CM | POA: Insufficient documentation
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
## 2013-01-12 ENCOUNTER — Telehealth (HOSPITAL_COMMUNITY): Payer: Self-pay | Admitting: *Deleted

## 2013-01-12 NOTE — Progress Notes (Signed)
Writer contacted pt via telephone to discussed [employer] account for coverage of medical expenses associated with TMS tx that are not covered by her primary Medicare insurance. Pt reported that she was aware that this is an option. Writer encouraged pt to apply for account prior to first tx. Pt verbalized that she would establish her [employer] account tomorrow 01/13/2013. Writer also encouraged pt to be prepared to make payment toward her tx prior to her first session. Pt verbalized that she would make a payment toward her tx expenses prior to first tx.

## 2013-01-25 ENCOUNTER — Ambulatory Visit (HOSPITAL_COMMUNITY): Payer: Self-pay | Admitting: Psychiatry

## 2013-01-25 ENCOUNTER — Encounter (INDEPENDENT_AMBULATORY_CARE_PROVIDER_SITE_OTHER): Payer: Self-pay

## 2013-01-25 ENCOUNTER — Ambulatory Visit (INDEPENDENT_AMBULATORY_CARE_PROVIDER_SITE_OTHER): Payer: Medicare Other | Admitting: Psychiatry

## 2013-01-25 VITALS — BP 142/86 | HR 68 | Ht 63.0 in | Wt 150.4 lb

## 2013-01-25 DIAGNOSIS — F329 Major depressive disorder, single episode, unspecified: Secondary | ICD-10-CM | POA: Insufficient documentation

## 2013-01-25 DIAGNOSIS — F332 Major depressive disorder, recurrent severe without psychotic features: Secondary | ICD-10-CM

## 2013-01-25 DIAGNOSIS — F410 Panic disorder [episodic paroxysmal anxiety] without agoraphobia: Secondary | ICD-10-CM | POA: Diagnosis not present

## 2013-01-25 NOTE — Progress Notes (Signed)
Pt reported to [facility] for cortical mapping for Transcranial Magnetic Stimulation treatment for Major Depressive Disorder. Pt also received her initial TMS tx. Pt signed an informed consent document. Pt completed a PHQ-9 rating a score of 27. Pt also completed a Beck's Depression Inventory, rating a score of 42. Pt reported that she did not sleep well the previous night. Pt verbalized that she slept approximately 1 hour last night, which is uncharacteristic for her. Pt reported that she ceased taking her Xanax 3-4 days ago in order to prepare for TMS sessions. PT reported that her anxiety and panic attacks have increased since discontinuing the Xanax. Pt reported that she would like to have a medication added to her regimen to help her sleep and reduce her anxiety.  Pt's tx parameters which were calculated during cortical mapping are as follows: Tx AP: 10.0 cm, SOA: 41 degrees, Coil Angle: 0 degrees, Dose: 0.86 SMT. Pt tolerated tx well. %MT titrated up to 100%. Will attempt to titrate up to 120% at next tx.

## 2013-01-26 ENCOUNTER — Other Ambulatory Visit (HOSPITAL_COMMUNITY): Payer: Medicare Other | Attending: Psychiatry | Admitting: *Deleted

## 2013-01-26 VITALS — BP 136/82 | HR 80

## 2013-01-26 DIAGNOSIS — F329 Major depressive disorder, single episode, unspecified: Secondary | ICD-10-CM | POA: Diagnosis not present

## 2013-01-26 DIAGNOSIS — F332 Major depressive disorder, recurrent severe without psychotic features: Secondary | ICD-10-CM

## 2013-01-26 NOTE — Progress Notes (Signed)
Pt reported to [facility] for Transcranial Magnetic Stimulation treatment for Major Depressive Disorder. Pt reported that she had a panic attack yesterday evening when she got home from TMS tx. Pt also reported that she did not sleep well last night, sleeping a maximum of 3.5 hours. Pt reported that she did not drink any coffee this morning, which is uncharacteristic for her. Pt also reported that her appetite increased yesterday, stating that she "actually want[s] to eat." Pt tolerated tx well. %MT titrated up to 120%, where it will remain during future txs.

## 2013-01-27 ENCOUNTER — Other Ambulatory Visit (HOSPITAL_COMMUNITY): Payer: TRICARE For Life (TFL) | Attending: Psychiatry | Admitting: *Deleted

## 2013-01-27 VITALS — BP 152/86 | HR 80

## 2013-01-27 DIAGNOSIS — F332 Major depressive disorder, recurrent severe without psychotic features: Secondary | ICD-10-CM

## 2013-01-27 DIAGNOSIS — F329 Major depressive disorder, single episode, unspecified: Secondary | ICD-10-CM | POA: Diagnosis not present

## 2013-01-27 NOTE — Progress Notes (Signed)
Patient ID: Sheryl Parker, female   DOB: 1950-08-28, 63 y.o.   MRN: [id number] Pt [facility] for Rapid Transcranial Magnetic Stimulation treatment for Major Depressive Disorder. Pt presented with brighter affect than normal. Pt reported that her appetite continues to increase. Pt reported that she and her husband have stopped at the store on the way home the past two afternoons after TMS tx, which is uncharacteristic for her due to her agoraphobia and anxiety. Pt stated that she slept well last night, as she took a Unisom to help her sleep. Pt tolerated tx well. %MT remained at 120% for the duration of tx.

## 2013-01-30 ENCOUNTER — Other Ambulatory Visit (HOSPITAL_COMMUNITY): Payer: TRICARE For Life (TFL) | Attending: Psychiatry | Admitting: *Deleted

## 2013-01-30 VITALS — BP 146/84 | HR 76

## 2013-01-30 DIAGNOSIS — F332 Major depressive disorder, recurrent severe without psychotic features: Secondary | ICD-10-CM

## 2013-01-30 DIAGNOSIS — F329 Major depressive disorder, single episode, unspecified: Secondary | ICD-10-CM | POA: Diagnosis not present

## 2013-01-30 MED ORDER — HYDROXYZINE PAMOATE 25 MG PO CAPS: 25.0000 mg | ORAL_CAPSULE | Freq: Three times a day (TID) | ORAL | Status: DC | PRN

## 2013-01-30 NOTE — Progress Notes (Signed)
Patient ID: Sheryl Parker, female   DOB: 1950-12-19, 63 y.o.   MRN: [id number] Pt [facility] for Rapid Transcranial Magnetic Stimulation treatment for Major Depressive Disorder. Pt reported that she experienced elevated levels of anxiety over the weekend, including sustained periods of heartrate over 100 bpm. MD notified. To help with anxiety, pt prescribed Vistaril 25 mg tid prn. Pt reported that her appetite has remained normal. Pt tolerated tx well. %MT remained at 120% for the duration of tx.

## 2013-01-31 ENCOUNTER — Other Ambulatory Visit (HOSPITAL_COMMUNITY): Payer: TRICARE For Life (TFL) | Attending: Psychiatry | Admitting: *Deleted

## 2013-01-31 VITALS — BP 148/90 | HR 76

## 2013-01-31 DIAGNOSIS — F329 Major depressive disorder, single episode, unspecified: Secondary | ICD-10-CM | POA: Diagnosis not present

## 2013-01-31 DIAGNOSIS — F332 Major depressive disorder, recurrent severe without psychotic features: Secondary | ICD-10-CM

## 2013-01-31 NOTE — Progress Notes (Signed)
Patient ID: Sheryl Parker, female   DOB: [birth year], 63 y.o.   MRN: [id number] [facility] Rapid Transcranial Magnetic Stimulation treatment for Major Depressive Disorder. Pt reported that she awoke this morning having a panic attack. Pt verbalized that she coped by taking her morning dose of Adderall, which helped her "focus and get present." Pt endorsed feeling anxious upon arrival to clinic. Pt verbalized that she ate a lot yesterday evening, and enjoyed what she ate, which is abnormal for her. Pt tolerated tx well. %MT remained at 120% for the duration of tx.

## 2013-02-01 ENCOUNTER — Other Ambulatory Visit (HOSPITAL_COMMUNITY): Payer: TRICARE For Life (TFL) | Attending: Psychiatry | Admitting: *Deleted

## 2013-02-01 VITALS — BP 152/90 | HR 80 | Ht 63.0 in | Wt 150.8 lb

## 2013-02-01 DIAGNOSIS — F332 Major depressive disorder, recurrent severe without psychotic features: Secondary | ICD-10-CM

## 2013-02-01 DIAGNOSIS — F329 Major depressive disorder, single episode, unspecified: Secondary | ICD-10-CM | POA: Diagnosis not present

## 2013-02-01 NOTE — Progress Notes (Signed)
Patient ID: Sheryl Parker, female   DOB: 1950-12-22, 63 y.o.   MRN: [id number] [facility] for Rapid Transcranial Magnetic Stimulation treatment for Major Depressive Disorder. Pt reported that she took the Vistaril 25 mg prescribed to her for the first time last night before bed. Pt reported that she woke up anxious this morning, "as though I had been in a panic attack in my sleep." Pt completed a PHQ-9, rating a score of 20. Pt tolerated tx well. %MT remained at 120% for the duration of tx.

## 2013-02-02 ENCOUNTER — Other Ambulatory Visit (HOSPITAL_COMMUNITY): Payer: TRICARE For Life (TFL) | Attending: Psychiatry | Admitting: *Deleted

## 2013-02-02 VITALS — BP 158/80 | HR 84

## 2013-02-02 DIAGNOSIS — F329 Major depressive disorder, single episode, unspecified: Secondary | ICD-10-CM | POA: Diagnosis not present

## 2013-02-02 DIAGNOSIS — F332 Major depressive disorder, recurrent severe without psychotic features: Secondary | ICD-10-CM

## 2013-02-02 NOTE — Progress Notes (Signed)
Patient ID: Sheryl Parker, female   DOB: 1950/05/25, 63 y.o.   MRN: [id number] Pt [facility] to [facility] for Rapid Transcranial Magnetic Stimulation treatment for Major Depressive Disorder. Pt reported that she felt "set back" today, but could not "put my finger on why." Pt tolerated tx well. %MT remained at 120% for the duration of tx.

## 2013-02-03 ENCOUNTER — Other Ambulatory Visit (HOSPITAL_COMMUNITY): Payer: TRICARE For Life (TFL) | Attending: Psychiatry | Admitting: *Deleted

## 2013-02-03 VITALS — BP 134/86 | HR 68

## 2013-02-03 DIAGNOSIS — F332 Major depressive disorder, recurrent severe without psychotic features: Secondary | ICD-10-CM

## 2013-02-03 DIAGNOSIS — F329 Major depressive disorder, single episode, unspecified: Secondary | ICD-10-CM | POA: Diagnosis not present

## 2013-02-03 NOTE — Progress Notes (Signed)
Patient ID: Sheryl Parker, female   DOB: 12-06-1950, 63 y.o.   MRN: [id number] [facility] Rapid Transcranial Magnetic Stimulation treatment for Major Depressive Disorder. Pt reported that she feels "washed up" sometimes after TMS tx. Pt verbalized that by "washed up" she means tired, drowsy, etc. Pt tolerated tx well. %MT remained at 120% for the duration of tx.

## 2013-02-06 ENCOUNTER — Other Ambulatory Visit (HOSPITAL_COMMUNITY): Payer: TRICARE For Life (TFL) | Attending: Psychiatry | Admitting: *Deleted

## 2013-02-06 VITALS — BP 128/78 | HR 72

## 2013-02-06 DIAGNOSIS — F332 Major depressive disorder, recurrent severe without psychotic features: Secondary | ICD-10-CM

## 2013-02-06 DIAGNOSIS — F329 Major depressive disorder, single episode, unspecified: Secondary | ICD-10-CM | POA: Diagnosis not present

## 2013-02-06 NOTE — Progress Notes (Signed)
Patient ID: Sheryl Parker, female   DOB: 10-Oct-1950, 63 y.o.   MRN: [id number] Pt [facility] for Rapid Transcranial Magnetic Stimulation treatment for Major Depressive Disorder. Pt reported that her heart rate remained normal over the weekend, and her crying spells only lasted a few minutes at a time. Pt reported that she has been taking her Vistaril when she feels anxious, and it has been helping to take the edge off. Pt remarked that she continues to feel better with more TMS, but has yet to see improvement with her agoraphobia. Pt tolerated tx well. %MT remained at 120% for the duration of tx.

## 2013-02-07 ENCOUNTER — Other Ambulatory Visit (HOSPITAL_COMMUNITY): Payer: TRICARE For Life (TFL) | Attending: Psychiatry | Admitting: *Deleted

## 2013-02-07 VITALS — BP 130/82 | HR 64

## 2013-02-07 DIAGNOSIS — F329 Major depressive disorder, single episode, unspecified: Secondary | ICD-10-CM | POA: Diagnosis not present

## 2013-02-07 DIAGNOSIS — F332 Major depressive disorder, recurrent severe without psychotic features: Secondary | ICD-10-CM

## 2013-02-07 NOTE — Progress Notes (Signed)
Patient ID: Sheryl Parker, female   DOB: 1950/05/14, 63 y.o.   MRN: [id number] [facility] Rapid Transcranial Magnetic Stimulation treatment for Major Depressive Disorder. Pt reported that she had a "total melt down" last night, crying for approximately 2 hours. Pt verbalized that an argument with her husband precipitated this "melt down," but pt stated that she is unsure why her "melt down" was so severe. Pt reported that she also experienced intense feelings worthlessness and guilt last night. Pt reported that she awoke this morning feeling angry and irritable, but she was able to cope with these feelings throughout the day. Pt also reported that she ate 75% of an omlet last night for supper, which is the first solid meal she has eaten in days. Pt drinks Ensure for nutrition on days when she has no appetite. Pt tolerated tx well. %MT remained at 120% for the duration of tx.

## 2013-02-08 ENCOUNTER — Other Ambulatory Visit (HOSPITAL_COMMUNITY): Payer: TRICARE For Life (TFL) | Attending: Psychiatry | Admitting: *Deleted

## 2013-02-08 VITALS — BP 136/90 | HR 96 | Ht 63.0 in | Wt 150.6 lb

## 2013-02-08 DIAGNOSIS — F332 Major depressive disorder, recurrent severe without psychotic features: Secondary | ICD-10-CM | POA: Diagnosis not present

## 2013-02-08 NOTE — Progress Notes (Signed)
Patient ID: Sheryl Parker, female   DOB: [birth year], 63 y.o.   MRN: [id number] [facility] for Rapid Transcranial Magnetic Stimulation treatment for Major Depressive Disorder. Pt reported that she slept well last night, and that her day today has been going well. Pt continues to abstain from eating, except the Ensure she consumes daily. Pt completed a PHQ-9, rating a score of 18. Pt tolerated tx well. %MT remained at 120%.

## 2013-02-09 ENCOUNTER — Other Ambulatory Visit (HOSPITAL_COMMUNITY): Payer: TRICARE For Life (TFL) | Attending: Psychiatry | Admitting: *Deleted

## 2013-02-09 VITALS — BP 126/82 | HR 64

## 2013-02-09 DIAGNOSIS — F332 Major depressive disorder, recurrent severe without psychotic features: Secondary | ICD-10-CM

## 2013-02-09 DIAGNOSIS — F329 Major depressive disorder, single episode, unspecified: Secondary | ICD-10-CM | POA: Diagnosis not present

## 2013-02-09 NOTE — Progress Notes (Signed)
Patient ID: Sheryl Parker, female   DOB: 1950/06/13, 63 y.o.   MRN: [id number] [facility] for Rapid Transcranial Magnetic Stimulation treatment for Major Depressive Disorder. Pt tolerated tx well. %MT remained at 120% for the duration of tx.

## 2013-02-10 ENCOUNTER — Other Ambulatory Visit (INDEPENDENT_AMBULATORY_CARE_PROVIDER_SITE_OTHER): Payer: TRICARE For Life (TFL) | Admitting: *Deleted

## 2013-02-10 VITALS — BP 150/92 | HR 92

## 2013-02-10 DIAGNOSIS — F329 Major depressive disorder, single episode, unspecified: Secondary | ICD-10-CM | POA: Diagnosis not present

## 2013-02-10 DIAGNOSIS — F332 Major depressive disorder, recurrent severe without psychotic features: Secondary | ICD-10-CM

## 2013-02-10 NOTE — Progress Notes (Signed)
Patient ID: Sheryl Sayeratricia Parker, female   DOB: 1950-12-17, 63 y.o.   MRN: [id number] [facility] for Rapid Transcranial Magnetic Stimulation treatment for Major Depressive Disorder. Pt reported that she ate a meal yesterday evening and today before reporting for tx. Pt reported feeling anxious today. Pt tolerated tx well. %MT remained at 120% for the duration of tx.

## 2013-02-13 ENCOUNTER — Other Ambulatory Visit (INDEPENDENT_AMBULATORY_CARE_PROVIDER_SITE_OTHER): Payer: TRICARE For Life (TFL) | Admitting: *Deleted

## 2013-02-13 VITALS — BP 136/84 | HR 80

## 2013-02-13 DIAGNOSIS — F332 Major depressive disorder, recurrent severe without psychotic features: Secondary | ICD-10-CM

## 2013-02-13 DIAGNOSIS — F329 Major depressive disorder, single episode, unspecified: Secondary | ICD-10-CM | POA: Diagnosis not present

## 2013-02-13 NOTE — Progress Notes (Signed)
Patient ID: Sheryl Parker, female   DOB: 1950/02/20, 63 y.o.   MRN: [id number] Pt [facility] for Rapid Transcranial Magnetic Stimulation treatment for Major Depressive Disorder. Pt reported that she ate solid food consistently over the weekend. Pt also reported that she has not been consuming caffeine for the past few days. Pt reported that she slept better over the weekend, as well. Pt tolerated tx well. %MT remained at 120% for the duration of tx.

## 2013-02-14 ENCOUNTER — Other Ambulatory Visit (INDEPENDENT_AMBULATORY_CARE_PROVIDER_SITE_OTHER): Payer: TRICARE For Life (TFL) | Admitting: *Deleted

## 2013-02-14 VITALS — BP 142/92 | HR 88

## 2013-02-14 DIAGNOSIS — F332 Major depressive disorder, recurrent severe without psychotic features: Secondary | ICD-10-CM

## 2013-02-14 DIAGNOSIS — F329 Major depressive disorder, single episode, unspecified: Secondary | ICD-10-CM | POA: Diagnosis not present

## 2013-02-14 NOTE — Progress Notes (Signed)
Patient ID: Sheryl Parker, female   DOB: 1950-04-11, 63 y.o.   MRN: [id number] [facility] for Rapid Transcranial Magnetic Stimulation treatment for Major Depressive Disorder. Pt reported that she has continued to eat solid food and is looking forward to dinner this evening. Pt reported that she feels that she is "doing a little better" with regard to her depression. Pt tolerated tx well. %MT remained at 120% for the duration of tx.

## 2013-02-15 ENCOUNTER — Other Ambulatory Visit (INDEPENDENT_AMBULATORY_CARE_PROVIDER_SITE_OTHER): Payer: TRICARE For Life (TFL) | Admitting: *Deleted

## 2013-02-15 VITALS — BP 156/84 | HR 92 | Ht 63.0 in | Wt 152.6 lb

## 2013-02-15 DIAGNOSIS — F332 Major depressive disorder, recurrent severe without psychotic features: Secondary | ICD-10-CM

## 2013-02-15 DIAGNOSIS — F329 Major depressive disorder, single episode, unspecified: Secondary | ICD-10-CM | POA: Diagnosis not present

## 2013-02-15 NOTE — Progress Notes (Signed)
Patient ID: Sheryl Parker, female   DOB: [birth year], 63 y.o.   MRN: [id number] [facility] Rapid Transcranial Magnetic Stimulation treatment for Major Depressive Disorder. Pt appeared happy today for treatment and reported that she is doing very well at home. Pt reports that she has not cried at home this week and that is a milestone for her. Pt reports being more social and feel better and than she has in long time. Pt scored 13 on the weekly PHQ-9 questionnaire. Pt tolerated tx well. %MT stayed at 120 for duration of tx.

## 2013-02-16 ENCOUNTER — Other Ambulatory Visit (HOSPITAL_COMMUNITY): Payer: TRICARE For Life (TFL)

## 2013-02-17 ENCOUNTER — Other Ambulatory Visit (INDEPENDENT_AMBULATORY_CARE_PROVIDER_SITE_OTHER): Payer: TRICARE For Life (TFL) | Admitting: *Deleted

## 2013-02-17 VITALS — BP 148/84 | HR 76

## 2013-02-17 DIAGNOSIS — F332 Major depressive disorder, recurrent severe without psychotic features: Secondary | ICD-10-CM

## 2013-02-17 DIAGNOSIS — F329 Major depressive disorder, single episode, unspecified: Secondary | ICD-10-CM | POA: Diagnosis not present

## 2013-02-17 NOTE — Progress Notes (Signed)
Patient ID: Sheryl Parker, female   DOB: [birth year], 63 y.o.   MRN: [id number] [facility] for Rapid Transcranial Magnetic Stimulation treatment for Major Depressive Disorder. Pt reported that her dog had a stroke the previous day, but she was able to cope with this situation much more effectively than she thought she would be able to. Pt stated that under normal circumstances, she would have "fallen apart at the seems" in this situation with her dog, which she cares about deeply. Pt attributes her increased ability to cope to the effectiveness of the TMS therapy she is receiving. Pt stated that "I am feeling more like the old me." Pt tolerated tx well. %MT remained at 120% for the duration of tx.

## 2013-02-20 ENCOUNTER — Other Ambulatory Visit (INDEPENDENT_AMBULATORY_CARE_PROVIDER_SITE_OTHER): Payer: TRICARE For Life (TFL) | Admitting: *Deleted

## 2013-02-20 ENCOUNTER — Other Ambulatory Visit (HOSPITAL_COMMUNITY): Payer: TRICARE For Life (TFL) | Admitting: *Deleted

## 2013-02-20 VITALS — BP 140/86 | HR 72

## 2013-02-20 DIAGNOSIS — F329 Major depressive disorder, single episode, unspecified: Secondary | ICD-10-CM | POA: Diagnosis not present

## 2013-02-20 DIAGNOSIS — F332 Major depressive disorder, recurrent severe without psychotic features: Secondary | ICD-10-CM

## 2013-02-20 NOTE — Progress Notes (Signed)
Patient ID: Sheryl Parker, female   DOB: May 21, 1950, 63 y.o.   MRN: [id number] Pt [facility] for Repetitive Transcranial Magnetic Stimulation treatment for Major Depressive Disorder. Pt reported that she continues to be able to deal with stressors more effectively than before she began TMS treatment. Pt also reported that she has been contacting loved ones recently, as opposed to the isolation she experienced during her current depressive episode. Pt tolerated tx well. %MT remained at 120% for the duration of tx.

## 2013-02-21 ENCOUNTER — Other Ambulatory Visit (HOSPITAL_COMMUNITY): Payer: TRICARE For Life (TFL) | Admitting: *Deleted

## 2013-02-22 ENCOUNTER — Other Ambulatory Visit (INDEPENDENT_AMBULATORY_CARE_PROVIDER_SITE_OTHER): Payer: TRICARE For Life (TFL) | Admitting: *Deleted

## 2013-02-22 VITALS — BP 136/88 | HR 80 | Ht 63.0 in | Wt 150.8 lb

## 2013-02-22 DIAGNOSIS — F332 Major depressive disorder, recurrent severe without psychotic features: Secondary | ICD-10-CM

## 2013-02-22 DIAGNOSIS — F329 Major depressive disorder, single episode, unspecified: Secondary | ICD-10-CM | POA: Diagnosis not present

## 2013-02-22 NOTE — Progress Notes (Signed)
Patient ID: Sheryl Parker, female   DOB: 1950/09/16, 63 y.o.   MRN: [id number] [facility] Rapid Transcranial Magnetic Stimulation treatment for Major Depressive Disorder. Pt reported that she has been eating less the past few days. Pt verbalized that she feels better than when she first began TMS treatment, although it is often difficult to remain aware of her progress. Pt tolerated tx well. Tx auto-paused 1X due to loss of coil contact. Coil contact regained and tx resumed without issue. %MT remained at 120% for the duration of tx.

## 2013-02-23 ENCOUNTER — Other Ambulatory Visit (INDEPENDENT_AMBULATORY_CARE_PROVIDER_SITE_OTHER): Payer: TRICARE For Life (TFL) | Admitting: *Deleted

## 2013-02-23 VITALS — BP 134/90 | HR 80

## 2013-02-23 DIAGNOSIS — F332 Major depressive disorder, recurrent severe without psychotic features: Secondary | ICD-10-CM

## 2013-02-23 DIAGNOSIS — F329 Major depressive disorder, single episode, unspecified: Secondary | ICD-10-CM | POA: Diagnosis not present

## 2013-02-23 NOTE — Progress Notes (Signed)
Patient ID: Sheryl Parker, female   DOB: January 17, 1950, 63 y.o.   MRN: [id number] [facility] for Repetitive Transcranial Magnetic Stimulation treatment for Major Depressive Disorder. Pt presented with bright affect. Pt verbalized that she has been speaking with her sister almost daily by phone, as she reports "I need positive people in my life." Pt scored 11 on the weekly PHQ-9. Pt tolerated tx well. %MT remained at 120% for the duration of tx.

## 2013-02-24 ENCOUNTER — Other Ambulatory Visit (INDEPENDENT_AMBULATORY_CARE_PROVIDER_SITE_OTHER): Payer: TRICARE For Life (TFL) | Admitting: *Deleted

## 2013-02-24 VITALS — BP 144/86 | HR 76

## 2013-02-24 DIAGNOSIS — F329 Major depressive disorder, single episode, unspecified: Secondary | ICD-10-CM | POA: Diagnosis not present

## 2013-02-24 DIAGNOSIS — F332 Major depressive disorder, recurrent severe without psychotic features: Secondary | ICD-10-CM

## 2013-02-24 NOTE — Progress Notes (Signed)
Patient ID: Sheryl Parker, female   DOB: 1950/01/20, 63 y.o.   MRN: [id number] [facility] for Repetitive Transcranial Magnetic Stimulation treatment for Major Depressive Disorder. Pt reported that she encountered a significant life stressor on 2/19, as she found out her mother-in-law is ill. Pt verbalized that she was able to cope with this stressor effectively, whereas she would not have been able to before receiving TMS tx. Pt tolerated tx well. %MT remained at 120% for the duration of tx.

## 2013-02-27 ENCOUNTER — Other Ambulatory Visit (INDEPENDENT_AMBULATORY_CARE_PROVIDER_SITE_OTHER): Payer: TRICARE For Life (TFL) | Admitting: *Deleted

## 2013-02-27 VITALS — BP 118/78 | HR 84

## 2013-02-27 DIAGNOSIS — F332 Major depressive disorder, recurrent severe without psychotic features: Secondary | ICD-10-CM

## 2013-02-27 DIAGNOSIS — F329 Major depressive disorder, single episode, unspecified: Secondary | ICD-10-CM | POA: Diagnosis not present

## 2013-02-27 NOTE — Progress Notes (Signed)
Patient ID: Sheryl Parker, female   DOB: July 02, 1950, 63 y.o.   MRN: [id number] [facility] for Repetitive Transcranial Magnetic Stimulation treatment for Major Depressive Disorder. Pt reported that she had several disturbing nightmares on Saturday night. Pt reported that she continues to experience decreased intensity of depressive symptoms. Pt tolerated tx well. %MT remained at 120% for the duration of tx.

## 2013-02-28 ENCOUNTER — Other Ambulatory Visit (HOSPITAL_COMMUNITY): Payer: TRICARE For Life (TFL)

## 2013-03-01 ENCOUNTER — Other Ambulatory Visit (INDEPENDENT_AMBULATORY_CARE_PROVIDER_SITE_OTHER): Payer: TRICARE For Life (TFL) | Admitting: *Deleted

## 2013-03-01 VITALS — BP 140/78 | HR 88 | Ht 63.0 in | Wt 151.8 lb

## 2013-03-01 DIAGNOSIS — F332 Major depressive disorder, recurrent severe without psychotic features: Secondary | ICD-10-CM

## 2013-03-01 DIAGNOSIS — F329 Major depressive disorder, single episode, unspecified: Secondary | ICD-10-CM | POA: Diagnosis not present

## 2013-03-01 NOTE — Progress Notes (Signed)
Patient ID: Sheryl Parker, female   DOB: 1950-05-22, 63 y.o.   MRN: [id number] [facility] Repetitive Transcranial Magnetic Stimulation treatment for Major Depressive Disorder. Pt scored at 6 on the weekly PHQ-9 questionnaire. Pt scored a 15 on the Beck's Depression Inventory. Pt verbalized that she feels that she has made great improvement with regard to her depression. Pt tolerated tx well. %MT remained at 120% for the duration of tx.

## 2013-03-02 ENCOUNTER — Other Ambulatory Visit (HOSPITAL_COMMUNITY): Payer: TRICARE For Life (TFL)

## 2013-03-03 ENCOUNTER — Other Ambulatory Visit (INDEPENDENT_AMBULATORY_CARE_PROVIDER_SITE_OTHER): Payer: TRICARE For Life (TFL) | Admitting: *Deleted

## 2013-03-03 VITALS — BP 134/78 | HR 72

## 2013-03-03 DIAGNOSIS — F332 Major depressive disorder, recurrent severe without psychotic features: Secondary | ICD-10-CM

## 2013-03-03 DIAGNOSIS — F329 Major depressive disorder, single episode, unspecified: Secondary | ICD-10-CM | POA: Diagnosis not present

## 2013-03-03 NOTE — Progress Notes (Signed)
Patient ID: Sheryl Parker, female   DOB: [birth year], 63 y.o.   MRN: [id number] [facility] for Repetitive Transcranial Magnetic Stimulation treatment for Major Depressive Disorder. Pt presented with bright affect. Pt tolerated tx well. %MT remained at 120% for the duration of tx.

## 2013-03-06 ENCOUNTER — Other Ambulatory Visit (HOSPITAL_COMMUNITY): Payer: TRICARE For Life (TFL) | Attending: Psychiatry | Admitting: *Deleted

## 2013-03-06 VITALS — BP 134/84 | HR 76

## 2013-03-06 DIAGNOSIS — F332 Major depressive disorder, recurrent severe without psychotic features: Secondary | ICD-10-CM

## 2013-03-06 DIAGNOSIS — F329 Major depressive disorder, single episode, unspecified: Secondary | ICD-10-CM | POA: Diagnosis not present

## 2013-03-06 NOTE — Progress Notes (Signed)
Patient ID: Sheryl Parker, female   DOB: 08-Oct-1950, 63 y.o.   MRN: [id number] [facility] Rapid Transcranial Magnetic Stimulation treatment for Major Depressive Disorder. Pt reported that she did not sleep well over the weekend. Pt verbalized that she slept for approximately 5 hours Friday night, approximately 3 hours Saturday night, and approximately 7 hours last night. Pt attributes this lack of sleep to her dog not being in good health. Pt reported that she felt weak today. Slight tremor noticed. Pt reported that she sometimes experiences a slight tremor when she is tired. Pt tolerated tx well. Tx auto paused 3X due to loss of coil contact. In each instance, coil contact regained and tx resumed without issue. %MT remained at 120% for the duration of tx.

## 2013-03-07 ENCOUNTER — Telehealth (HOSPITAL_COMMUNITY): Payer: Self-pay | Admitting: *Deleted

## 2013-03-07 ENCOUNTER — Other Ambulatory Visit (HOSPITAL_COMMUNITY): Payer: TRICARE For Life (TFL)

## 2013-03-07 NOTE — Progress Notes (Signed)
Called pt to clarify why appointment for today was canceled (appointment canceled due to "patient sick"). Pt reported that she felt weak and had increased tremors compared to those experienced on 03/06/2013. Pt also reported that she has not been eating much the past several days, and she has been supplementing her nutrition with Ensure shakes. Writer encouraged pt to attempt to rest and eat normally. Writer also encouraged pt to report to local ED if symptoms worsen. Pt verbalized that she would report to clinic for TMS tx tomorrow if she felt better.

## 2013-03-08 ENCOUNTER — Telehealth (HOSPITAL_COMMUNITY): Payer: Self-pay | Admitting: *Deleted

## 2013-03-08 ENCOUNTER — Other Ambulatory Visit (HOSPITAL_COMMUNITY): Payer: TRICARE For Life (TFL)

## 2013-03-08 NOTE — Progress Notes (Signed)
Pt called this writer to confirm that she had been taking antibiotics for an oral abscess, which caused the shakiness noted on 03/06/2013. Pt informed writer that her abscess had not yet been diagnosed by a physician, and that the antibiotics she was taking belonged to her husband. Pt informed Clinical research associatewriter that she would attempt to get an appointment with a dentist as soon as possible. Pt verbalized that she would return for TMS tx when she felt able. Writer encouraged pt not to take medications not prescribed to her and to cease taking medications that cause an adverse reaction (tremors, etc.).

## 2013-03-08 NOTE — Progress Notes (Signed)
Called pt to inquire if she would be coming to clinic for tx today. Pt's husband informed Clinical research associatewriter that the pt had been experiencing diarrhea due to antibiotics she had been taking since 03/04/2013 for an oral abscess. Pt's husband informed Clinical research associatewriter that the pt would not be coming [facility] Trans[facility] Magnetic Stimulation treatment today.

## 2013-03-10 ENCOUNTER — Other Ambulatory Visit (INDEPENDENT_AMBULATORY_CARE_PROVIDER_SITE_OTHER): Payer: TRICARE For Life (TFL) | Admitting: *Deleted

## 2013-03-10 VITALS — BP 138/84 | HR 76 | Ht 63.0 in | Wt 149.0 lb

## 2013-03-10 DIAGNOSIS — F329 Major depressive disorder, single episode, unspecified: Secondary | ICD-10-CM | POA: Diagnosis not present

## 2013-03-10 DIAGNOSIS — F332 Major depressive disorder, recurrent severe without psychotic features: Secondary | ICD-10-CM

## 2013-03-10 NOTE — Progress Notes (Signed)
Patient ID: Sheryl Parker, female   DOB: [birth year], 63 y.o.   MRN: [id number] [facility] Repetitive Transcranial Magnetic Stimulation treatment for Major Depressive Disorder. Pt reported that she felt much better than the day of her previous tx. Pt reported that she visited the dentist who prescribed her a different antibiotic for her oral infection. Pt reported that she has not experienced tremors since switching antibiotic. Pt tolerated tx well. %MT remained at 120% for the duration of tx.

## 2013-03-13 ENCOUNTER — Other Ambulatory Visit (INDEPENDENT_AMBULATORY_CARE_PROVIDER_SITE_OTHER): Payer: TRICARE For Life (TFL) | Admitting: *Deleted

## 2013-03-13 VITALS — BP 146/94 | HR 96

## 2013-03-13 DIAGNOSIS — F332 Major depressive disorder, recurrent severe without psychotic features: Secondary | ICD-10-CM

## 2013-03-13 DIAGNOSIS — F329 Major depressive disorder, single episode, unspecified: Secondary | ICD-10-CM | POA: Diagnosis not present

## 2013-03-13 NOTE — Progress Notes (Signed)
Patient ID: Sheryl Sayeratricia Parker, female   DOB: 1950/06/08, 63 y.o.   MRN: [id number] [facility] for Repetitive Transcranial Magnetic Stimulation treatment for Major Depressive Disorder. 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 Pt reported that she was prescribed doxycycline to treat her Lyme's Disease, which she has been taking as prescribed. Writer notified MD, and MD instructed writer to proceed with pt's tx. Pt tolerated tx well. Tx auto-paused 1X due to loss of coil contact. Coil contact regained and tx resumed without issue. %MT remained at 120% for the duration of tx.

## 2013-03-14 ENCOUNTER — Other Ambulatory Visit (HOSPITAL_COMMUNITY): Payer: Self-pay

## 2013-03-15 ENCOUNTER — Other Ambulatory Visit (HOSPITAL_COMMUNITY): Payer: TRICARE For Life (TFL)

## 2013-03-20 ENCOUNTER — Encounter (HOSPITAL_COMMUNITY): Payer: Self-pay

## 2013-03-21 ENCOUNTER — Telehealth (HOSPITAL_COMMUNITY): Payer: Self-pay | Admitting: *Deleted

## 2013-03-21 NOTE — Progress Notes (Signed)
Pt's husband left voicemail on Sunday 03/19/13 to inform this writer that the pt had an EKG on Friday 03/17/13. Pt's EKG showed tachycardia, and pt's husband expressed that he wanted an MD to call [employer] to discuss the effects of TMS treatment on tachycardia. Writer attempted to call pt 4X on Monday 03/20/13 to discuss this matter, but each call went to voice mail. In each instance, writer left voice message requesting for pt to call back. Pt called after business hours on Monday 03/20/13 and left a voice message for this writer stating that her husband called [employer] on 03/20/13, and the [employer] representative advised pt to consult with a "knowledgable physician" about the potential long term effects of receiving TMS tx while experiencing tachycardia. Pt expressed that she intends to discontinue TMS treatment until she is seen by a cardiologist.

## 2013-03-22 ENCOUNTER — Encounter (HOSPITAL_COMMUNITY): Payer: Self-pay

## 2013-03-23 ENCOUNTER — Other Ambulatory Visit: Payer: Self-pay | Admitting: Internal Medicine

## 2013-03-23 DIAGNOSIS — N6009 Solitary cyst of unspecified breast: Secondary | ICD-10-CM

## 2013-03-27 ENCOUNTER — Encounter (HOSPITAL_COMMUNITY): Payer: Self-pay

## 2013-04-17 ENCOUNTER — Other Ambulatory Visit: Payer: Self-pay

## 2013-04-19 ENCOUNTER — Telehealth (HOSPITAL_COMMUNITY): Payer: Self-pay | Admitting: *Deleted

## 2013-04-19 NOTE — Telephone Encounter (Signed)
Writer called pt to follow up on pt discontinuing Transcranial Magnetic Stimulation treatment. Pt informed Clinical research associatewriter that she has been advised by her PCP to "run everything she's doing by a cardiologist." This includes TMS treatment. Pt reported that she will be seeing a cardiologist in May, and she will follow up with writer after she has completed this appointment. Pt reported that she feels that her depression is "holding it's own." Pt said she feels the same as when she ceased TMS tx. Pt reported that she has been prescribed Prozac 20 mg daily, and this seems to be helping her maintain with regard to her depression.

## 2013-09-15 ENCOUNTER — Telehealth (HOSPITAL_COMMUNITY): Payer: Self-pay | Admitting: *Deleted

## 2013-09-15 NOTE — Telephone Encounter (Signed)
Called pt for 6 month follow up for Transcranial Magnetic Stimulation for Major Depression Disorder. Pt states "I made it through August, and August and February are always the worst months for me. I feel more level now than I have in my entire life. I handle things differently now than I used to before TMS. I feel like I woke up." Pt attributes most of her progress to TMS, although she has had some medication changes that have been beneficial, as well. Pt reported that her fibromyalgia is much worse, which causes her to be in pain much of the time. Pt completed a PHQ-9 with a score of 4 (still in remission). Will call pt again in 3 months for 9 month follow up call.

## 2014-01-24 ENCOUNTER — Other Ambulatory Visit: Payer: Self-pay | Admitting: Internal Medicine

## 2014-01-24 ENCOUNTER — Ambulatory Visit
Admission: RE | Admit: 2014-01-24 | Discharge: 2014-01-24 | Disposition: A | Discharge status: Home / Self Care | Payer: Medicare Other | Source: Ambulatory Visit | Attending: Internal Medicine | Admitting: Internal Medicine

## 2014-01-24 DIAGNOSIS — W540XXA Bitten by dog, initial encounter: Secondary | ICD-10-CM

## 2014-03-21 ENCOUNTER — Emergency Department (HOSPITAL_COMMUNITY)
Admission: EM | Admit: 2014-03-21 | Discharge: 2014-03-21 | Disposition: A | Discharge status: Home / Self Care | Payer: Medicare Other | Attending: Emergency Medicine | Admitting: Emergency Medicine

## 2014-03-21 ENCOUNTER — Emergency Department (HOSPITAL_COMMUNITY): Payer: Medicare Other

## 2014-03-21 ENCOUNTER — Encounter (HOSPITAL_COMMUNITY): Payer: Self-pay | Admitting: *Deleted

## 2014-03-21 DIAGNOSIS — M1990 Unspecified osteoarthritis, unspecified site: Secondary | ICD-10-CM | POA: Diagnosis not present

## 2014-03-21 DIAGNOSIS — Z720 Tobacco use: Secondary | ICD-10-CM | POA: Insufficient documentation

## 2014-03-21 DIAGNOSIS — I10 Essential (primary) hypertension: Secondary | ICD-10-CM | POA: Diagnosis not present

## 2014-03-21 DIAGNOSIS — R109 Unspecified abdominal pain: Secondary | ICD-10-CM | POA: Insufficient documentation

## 2014-03-21 DIAGNOSIS — Z8719 Personal history of other diseases of the digestive system: Secondary | ICD-10-CM | POA: Diagnosis not present

## 2014-03-21 DIAGNOSIS — M79645 Pain in left finger(s): Secondary | ICD-10-CM | POA: Diagnosis present

## 2014-03-21 DIAGNOSIS — G8929 Other chronic pain: Secondary | ICD-10-CM | POA: Insufficient documentation

## 2014-03-21 DIAGNOSIS — M797 Fibromyalgia: Secondary | ICD-10-CM | POA: Insufficient documentation

## 2014-03-21 DIAGNOSIS — F909 Attention-deficit hyperactivity disorder, unspecified type: Secondary | ICD-10-CM | POA: Insufficient documentation

## 2014-03-21 DIAGNOSIS — M549 Dorsalgia, unspecified: Secondary | ICD-10-CM | POA: Diagnosis not present

## 2014-03-21 DIAGNOSIS — I7300 Raynaud's syndrome without gangrene: Secondary | ICD-10-CM | POA: Diagnosis not present

## 2014-03-21 DIAGNOSIS — Z880 Allergy status to penicillin: Secondary | ICD-10-CM | POA: Diagnosis not present

## 2014-03-21 DIAGNOSIS — M869 Osteomyelitis, unspecified: Secondary | ICD-10-CM

## 2014-03-21 DIAGNOSIS — G43909 Migraine, unspecified, not intractable, without status migrainosus: Secondary | ICD-10-CM | POA: Diagnosis not present

## 2014-03-21 DIAGNOSIS — Z79899 Other long term (current) drug therapy: Secondary | ICD-10-CM | POA: Diagnosis not present

## 2014-03-21 LAB — CBC WITH DIFFERENTIAL/PLATELET
BASOS PCT: 0 % (ref 0–1)
Basophils Absolute: 0 10*3/uL (ref 0.0–0.1)
EOS ABS: 0.2 10*3/uL (ref 0.0–0.7)
Eosinophils Relative: 1 % (ref 0–5)
HCT: 42.7 % (ref 36.0–46.0)
Hemoglobin: 14.4 g/dL (ref 12.0–15.0)
LYMPHS ABS: 2.9 10*3/uL (ref 0.7–4.0)
Lymphocytes Relative: 24 % (ref 12–46)
MCH: 32.5 pg (ref 26.0–34.0)
MCHC: 33.7 g/dL (ref 30.0–36.0)
MCV: 96.4 fL (ref 78.0–100.0)
Monocytes Absolute: 0.6 10*3/uL (ref 0.1–1.0)
Monocytes Relative: 5 % (ref 3–12)
NEUTROS ABS: 8.4 10*3/uL — AB (ref 1.7–7.7)
NEUTROS PCT: 70 % (ref 43–77)
PLATELETS: 385 10*3/uL (ref 150–400)
RBC: 4.43 MIL/uL (ref 3.87–5.11)
RDW: 13.4 % (ref 11.5–15.5)
WBC: 12.1 10*3/uL — ABNORMAL HIGH (ref 4.0–10.5)

## 2014-03-21 LAB — BASIC METABOLIC PANEL
Anion gap: 10 (ref 5–15)
BUN: 11 mg/dL (ref 6–23)
CHLORIDE: 104 mmol/L (ref 96–112)
CO2: 27 mmol/L (ref 19–32)
Calcium: 9.9 mg/dL (ref 8.4–10.5)
Creatinine, Ser: 0.89 mg/dL (ref 0.50–1.10)
GFR calc Af Amer: 79 mL/min — ABNORMAL LOW (ref >90)
GFR calc non Af Amer: 68 mL/min — ABNORMAL LOW (ref >90)
GLUCOSE: 153 mg/dL — AB (ref 70–99)
POTASSIUM: 4.3 mmol/L (ref 3.5–5.1)
SODIUM: 141 mmol/L (ref 135–145)

## 2014-03-21 MED ORDER — MORPHINE SULFATE 4 MG/ML IJ SOLN
4.0000 mg | Freq: Once | INTRAMUSCULAR | Status: AC
  Administered 2014-03-21: 4 mg via INTRAVENOUS
  Filled 2014-03-21: qty 1

## 2014-03-21 MED ORDER — VANCOMYCIN HCL IN DEXTROSE 1-5 GM/200ML-% IV SOLN
1000.0000 mg | Freq: Once | INTRAVENOUS | Status: AC
  Administered 2014-03-21: 1000 mg via INTRAVENOUS
  Filled 2014-03-21: qty 200

## 2014-03-21 MED ORDER — ONDANSETRON HCL 4 MG PO TABS: 4.0000 mg | ORAL_TABLET | Freq: Four times a day (QID) | ORAL | Status: DC

## 2014-03-21 MED ORDER — OXYCODONE-ACETAMINOPHEN 5-325 MG PO TABS: 1.0000 | ORAL_TABLET | Freq: Four times a day (QID) | ORAL | Status: DC | PRN

## 2014-03-21 MED ORDER — ONDANSETRON HCL 4 MG PO TABS
4.0000 mg | ORAL_TABLET | Freq: Once | ORAL | Status: AC
  Administered 2014-03-21: 4 mg via ORAL
  Filled 2014-03-21: qty 1

## 2014-03-21 NOTE — ED Notes (Signed)
Paged Hand Specialist through [facility] at  [phone]

## 2014-03-21 NOTE — ED Notes (Signed)
Lt 5th finger infection,  Had dog bite in January to this finger ,

## 2014-03-21 NOTE — ED Provider Notes (Signed)
CSN: [id number]     Arrival date & time 03/21/14  1712 History   First MD Initiated Contact with Patient 03/21/14 1751     No chief complaint on file.    (Consider location/radiation/quality/duration/timing/severity/associated sxs/prior Treatment) HPI Comments: Patient is a 64 year old female who presents to the emergency department with a complaint of left fifth finger pain and infection. The patient states that in January 2016 she sustained a dog bite to the left hand. She states that when she had the x-ray that there was a chip of bone present. She was placed on 2 rounds of antibiotics. She is unsure of the names of the antibiotics at this time. She states they gradually the swelling and open areas from the dog bite went down and healed up, but as they improved, the middle of her fingers swell and there was an open area on the middle of her fifth finger. This continued to get worse over this past week. Yesterday she noticed that when the finger would hang down below her waist, or if she would move it, or if it was touched that she had pain. She became concerned, went to see her primary physician, and was sent to the emergency department for evaluation. The patient denies chills or fever. She has not noticed any red streaks, but has noticed increased redness and swelling of the finger itself.  The history is provided by the patient.    Past Medical History  Diagnosis Date  . Hypertension   . Arthritis   . Osteoporosis   . Fibromyalgia   . Disc degeneration   . Attention deficit     Dr. Misty StanleyLisa [place]  . Raynaud disease   . Chronic back pain     [place]  meds since 2 yrs ago  . Migraine   . IBS (irritable bowel syndrome)   . Diverticulitis    Past Surgical History  Procedure Laterality Date  . Oophorectomy  2002    right  . Tubal ligation     Family History  Problem Relation Age of Onset  . Colon polyps Brother   . Cirrhosis  Brother     etoh  . Breast cancer Sister   . Liver disease Son     Hepatitis C   History  Substance Use Topics  . Smoking status: Current Some Day Smoker -- 0.20 packs/day for 8 years    Types: Cigarettes  . Smokeless tobacco: Not on file     Comment: 1-3 cigarettes  . Alcohol Use: No     Comment: HEAVY ETOH X 25 YRS, 8 YRS AGO   OB History    No data available     Review of Systems  Constitutional: Negative for activity change.       All ROS Neg except as noted in HPI  HENT: Negative for nosebleeds.   Eyes: Negative for photophobia and discharge.  Respiratory: Negative for cough, shortness of breath and wheezing.   Cardiovascular: Negative for chest pain and palpitations.  Gastrointestinal: Positive for abdominal pain. Negative for blood in stool.  Genitourinary: Negative for dysuria, frequency and hematuria.  Musculoskeletal: Positive for back pain and arthralgias. Negative for neck pain.  Skin: Negative.   Neurological: Positive for headaches. Negative for dizziness, seizures and speech difficulty.  Psychiatric/Behavioral: Negative for hallucinations and confusion.      Allergies  Penicillins; Sulfa drugs cross reactors; Codeine; Levofloxacin; and Prednisone  Home Medications   Prior to Admission medications  Medication Sig Start Date End Date Taking? Authorizing Provider  amphetamine-dextroamphetamine (ADDERALL) 10 MG tablet Take 10 mg by mouth 4 (four) times daily.    Historical Provider, MD  clonazePAM (KLONOPIN) 0.5 MG tablet Take 0.5 mg by mouth 3 (three) times daily as needed. For anxiety.    Historical Provider, MD  dicyclomine (BENTYL) 10 MG capsule Take 10 mg by mouth 4 (four) times daily as needed. For IBS.    Historical Provider, MD  hydrOXYzine (VISTARIL) 25 MG capsule Take 1 capsule (25 mg total) by mouth 3 (three) times daily as needed for anxiety. 01/30/13   Nelly Rout, MD  lidocaine (LIDODERM) 5 % Place 1 patch onto the skin daily as needed. Remove  & Discard patch within 12 hours or as directed by MD For fibromyalgia    Historical Provider, MD  losartan (COZAAR) 50 MG tablet Take 50 mg by mouth at bedtime.    Historical Provider, MD  Multiple Vitamin (MULITIVITAMIN WITH MINERALS) TABS Take 1 tablet by mouth daily.    Historical Provider, MD  nutritional supplement (BOOST HIGH PROTEIN) LIQD Take 237 mLs by mouth 2 (two) times daily as needed. Drinks as much as possible for calorie intake    Historical Provider, MD  ondansetron (ZOFRAN) 4 MG tablet Take 4 mg by mouth every 8 (eight) hours as needed. For nausea    Historical Provider, MD  pantoprazole (PROTONIX) 40 MG tablet Take 40 mg by mouth daily.    Historical Provider, MD  promethazine (PHENERGAN) 25 MG tablet Take 25 mg by mouth every 8 (eight) hours as needed. For nausea.    Historical Provider, MD  rizatriptan (MAXALT) 10 MG tablet Take 10 mg by mouth as needed. May repeat in 2 hours if needed for migraines.    Historical Provider, MD   BP 151/88 mmHg  Pulse 65  Temp(Src) 98.4 F (36.9 C) (Oral)  Resp 20  Ht 5' 3.5" (1.613 m)  Wt 160 lb (72.576 kg)  BMI 27.89 kg/m2  SpO2 100% Physical Exam  Constitutional: She is oriented to person, place, and time. She appears well-developed and well-nourished.  Non-toxic appearance.  HENT:  Head: Normocephalic.  Right Ear: Tympanic membrane and external ear normal.  Left Ear: Tympanic membrane and external ear normal.  Eyes: EOM and lids are normal. Pupils are equal, round, and reactive to light.  Neck: Normal range of motion. Neck supple. Carotid bruit is not present.  Cardiovascular: Normal rate, regular rhythm, normal heart sounds, intact distal pulses and normal pulses.   Pulmonary/Chest: Breath sounds normal. No respiratory distress.  Abdominal: Soft. Bowel sounds are normal. There is no tenderness. There is no guarding.  Musculoskeletal: Normal range of motion.  There is increased swelling and redness from the DIP to the MP joint of  the left fifth finger. There is a small denuded area of the dorsum of the PIP area of the left fifth finger. There is increased redness extending beyond the fifth MP joint. The finger nor the hand on the left is noted to be hot. The patient cannot O will not move the DIP or MP joint due to pain. There is full range of motion of the left wrist, elbow, and shoulder. There no palpable nodes in the biceps triceps area, and no palpable nodes in the axilla. The capillary refill is less than 2 seconds.  Lymphadenopathy:       Head (right side): No submandibular adenopathy present.       Head (left side): No  submandibular adenopathy present.    She has no cervical adenopathy.  Neurological: She is alert and oriented to person, place, and time. She has normal strength. No cranial nerve deficit or sensory deficit.  Skin: Skin is warm and dry.  Psychiatric: Her speech is normal. Her mood appears anxious.  Nursing note and vitals reviewed.       ED Course  Case discussed with Dr Romeo Apple, he suggest hand surg. Evaluation. Case discussed with Dr Orlan Leavens (Hand). He will see pt the office tomorrow AM.   Procedures (including critical care time) Labs Review Labs Reviewed  CBC WITH DIFFERENTIAL/PLATELET  BASIC METABOLIC PANEL    Imaging Review Dg Hand Complete Left  03/21/2014   CLINICAL DATA:  LEFT hand pain and swelling with redness at fifth digit, bitten by a dog on 01/14/2014, never healed, gradually began to swell, history hypertension, Raynaud's  EXAM: LEFT HAND - COMPLETE 3+ VIEW  COMPARISON:  01/24/2014  FINDINGS: Soft tissue swelling little finger.  Significant PIP joint space narrowing little finger and new bone destruction at the head of the proximal phalanx.  Additionally, bone destruction at the base of the middle phalanx.  Findings are highly concerning for septic arthritis and osteomyelitis.  Diffuse osseous demineralization.  No acute fracture, dislocation, or additional bone destruction.   IMPRESSION: Significant soft tissue swelling of the LEFT little finger new since previous exam with progressive joint space loss and new bone destruction involving the base of the middle phalanx at head of the proximal phalanx most consistent with septic arthritis and osteomyelitis.  Findings called to Ivery Quale PA on 03/21/2014 at 1808 hours.   Electronically Signed   By: Ulyses Southward M.D.   On: 03/21/2014 18:10     EKG Interpretation None      MDM Diff. Dx. Fb left fifth finger.  Infection of the fifth finger from unknown injury source. Occult fracture. Osteomyelitis. Vital signs stable. PUlse Ox 100% on room air.  Xray is suggest new bone destruction involving the base of the middle phalanx at the head of the proximal phalanx c/w osteomyelitis or septic arthritis. WBC elevated at 12,100.  Case discussed with Dr Orlan Leavens. He will see pt in the office tomorrow AM.  Pt placed in bulky splint.   Final diagnoses:  None    **I have reviewed nursing notes, vital signs, and all appropriate lab and imaging results for this patient.Ivery Quale, PA-C 03/21/14 1950  Bethann Berkshire, MD 03/21/14 2019

## 2014-03-21 NOTE — Discharge Instructions (Signed)
Please keep your hand elevated above your heart is much as possible. Please see Dr. Apolonio Schneiders tomorrow morning in the office. May use Tylenol for mild pain, use Percocet for more severe pain. May use Zofran for nausea if needed. Percocet may cause drowsiness, and/or constipation. Please use this medication with caution. Bone and Joint Infections Joint infections are called septic or infectious arthritis. An infected joint may damage cartilage and tissue very quickly. This may destroy the joint. Bone infections (osteomyelitis) may last for years. Joints may become stiff if left untreated. Bacteria are the most common cause. Other causes include viruses and fungi, but these are more rare. Bone and joint infections usually come from injury or infection elsewhere in your body; the germs are carried to your bones or joints through the bloodstream.  CAUSES   Blood-carried germs from an infection elsewhere in your body can eventually spread to a bone or joint. The germ staphylococcus is the most common cause of both osteomyelitis and septic arthritis.  An injury can introduce germs into your bones or joints. SYMPTOMS   Weight loss.  Tiredness.  Chills and fever.  Bone or joint pain at rest and with activity.  Tenderness when touching the area or bending the joint.  Refusal to bear weight on a leg or inability to use an arm due to pain.  Decreased range of motion in a joint.  Skin redness, warmth, and tenderness.  Open skin sores and drainage. RISK FACTORS Children, the elderly, and those with weak immune systems are at increased risk of bone and joint infections. It is more common in people with HIV infections and with people on chemotherapy. People are also at increased risk if they have surgery where metal implants are used to stabilize the bone. Plates, screws, or artificial joints provide a surface that bacteria can stick on. Such a growth of bacteria is called biofilm. The biofilm protects  bacteria from antibiotics and bodily defenses. This allows germs to multiply. Other reasons for increased risks include:   Having previous surgery or injury of a bone or joint.  Being on high-dose corticosteroids and immunosuppressive medications that weaken your body's resistance to germs.  Diabetes and long-standing diseases.  Use of intravenous street drugs.  Being on hemodialysis.  Having a history of urinary tract infections.  Removal of your spleen (splenectomy). This weakens your immunity.  Chronic viral infections such as HIV or AIDS.  Lack of sensation such as paraplegia, quadriplegia, or spina bifida. DIAGNOSIS   Increased numbers of white blood cells in your blood may indicate infection. Some times your caregivers are able to identify the infecting germs by testing your blood. Inflammatory markers present in your bloodstream such as an erythrocyte sedimentation rate (ESR or sed rate) or c-reactive protein (CRP) can be indicators of deep infection.  Bone scans and X-ray exams are necessary for diagnosing osteomyelitis. They may help your caregiver find the infected areas. Other studies may give more detailed information. They may help detect fluid collections around a joint, abnormal bone surfaces, or be useful in diagnosing septic arthritis. They can find soft tissue swelling and find excess fluid in an infected joint or the adjacent bone. These tests include:  Ultrasound.  CT (computerized tomography).  MRI (magnetic resonance imaging).  The best test for diagnosing a bone or joint infection is an aspiration or biopsy. Your caregiver will usually use a local anesthetic. He or she can then remove tissue from a bone injury or use a needle to take  fluids from an infected joint. A local anesthetic medication numbs the area to be biopsied. Often biopsies are done in the operating room under general anesthesia. This means you will be asleep during the procedure. Tests performed  on these samples can identify an infection. TREATMENT   Treatment can help control long-standing infections, but infections may come back.  Infections can infect any bone or joint at any age.  Bone and joint infections are rarely fatal.  Bone infection left untreated can become a never-ending infection. It can spread to other areas of your body. It may eventually cause bone death. Reduced limb or joint function can result. In severe cases, this may require removal of a limb. Spinal osteomyelitis is very dangerous. Untreated, it may damage spinal nerves and cause death.  The most common complication of septic arthritis is osteoarthritis with pain and decreased range of motion of the joint. Some forms of treatment may include:  If the infection is caused by bacteria, it is generally treated with antibiotics. You will likely receive the drugs through a vein (intravenously) for anywhere from 2 to 6 weeks. In some cases, especially with children, oral antibiotics following an initial intravenous dose may be effective. The treatment you receive depends on the:  Type of bacteria.  Location of the infection.  Type of surgery that might be done.  Other health conditions or issues you might have.  Your caregiver may drain soft tissue abscesses or pockets of fluid around infected bones or joints. If you have septic arthritis, your caregiver may use a needle to drain pus from the joint on a daily basis. He or she may use an arthroscope to clean the joint or may need to open the joint surgically to remove damaged tissue and infection. An arthroscope is an instrument like a thin lighted telescope. It can be used to look inside the joint.  Surgery is usually needed if the infection has become long-standing. It may also be needed if there is hardware (such as metal plates, screws, or artificial joints) inside the patient. Sometimes a bone or muscle graft is needed to fill in the open space. This promotes  growth of new tissues and better blood flow to the area. PREVENTION   Clean and disinfect wounds quickly to help prevent the start of a bone or joint infection. Get treatment for any infections to prevent spread to a bone or joint.  Do not smoke. Smoking decreases healing rates of bone and predisposes to infection.  When given medications that suppress your immune system, use them according to your caregiver's instructions. Do not take more than prescribed for your condition.  Take good care of your feet and skin, especially if you have diabetes, decreased sensation or circulation problems. SEEK IMMEDIATE MEDICAL CARE IF:   You cannot bear weight on a leg or use an arm, especially following a minor injury. This can be a sign of bone or joint infection.  You think you may have signs or symptoms of a bone or joint infection. Your chance of getting rid of an infection is better if treated early. Document Released: 12/22/2004 Document Revised: 03/16/2011 Document Reviewed: 11/21/2008 [facility] Patient Information 2015 [place], [place]. This information is not intended to replace advice given to you by your health care provider. Make sure you discuss any questions you have with your health care provider.

## 2014-03-21 NOTE — ED Notes (Signed)
PA at bedside.

## 2014-03-21 NOTE — ED Notes (Signed)
Discharge instructions given, pt demonstrated teach back and verbal understanding. No concerns voiced.  

## 2014-03-23 ENCOUNTER — Ambulatory Visit (HOSPITAL_COMMUNITY): Payer: Medicare Other | Admitting: Certified Registered Nurse Anesthetist

## 2014-03-23 ENCOUNTER — Encounter (HOSPITAL_COMMUNITY): Payer: Self-pay | Admitting: *Deleted

## 2014-03-23 ENCOUNTER — Ambulatory Visit (HOSPITAL_COMMUNITY)
Admission: RE | Admit: 2014-03-23 | Discharge: 2014-03-23 | Disposition: A | Discharge status: Home / Self Care | Payer: Medicare Other | Source: Ambulatory Visit | Attending: Orthopedic Surgery | Admitting: Orthopedic Surgery

## 2014-03-23 ENCOUNTER — Encounter (HOSPITAL_COMMUNITY)
Admission: RE | Disposition: A | Discharge status: Home / Self Care | Payer: Self-pay | Source: Ambulatory Visit | Attending: Orthopedic Surgery

## 2014-03-23 DIAGNOSIS — Z791 Long term (current) use of non-steroidal anti-inflammatories (NSAID): Secondary | ICD-10-CM | POA: Diagnosis not present

## 2014-03-23 DIAGNOSIS — S61257A Open bite of left little finger without damage to nail, initial encounter: Secondary | ICD-10-CM | POA: Diagnosis not present

## 2014-03-23 DIAGNOSIS — L02512 Cutaneous abscess of left hand: Secondary | ICD-10-CM | POA: Diagnosis not present

## 2014-03-23 DIAGNOSIS — L089 Local infection of the skin and subcutaneous tissue, unspecified: Secondary | ICD-10-CM | POA: Diagnosis present

## 2014-03-23 DIAGNOSIS — W540XXA Bitten by dog, initial encounter: Secondary | ICD-10-CM | POA: Diagnosis not present

## 2014-03-23 DIAGNOSIS — I10 Essential (primary) hypertension: Secondary | ICD-10-CM | POA: Insufficient documentation

## 2014-03-23 DIAGNOSIS — M797 Fibromyalgia: Secondary | ICD-10-CM | POA: Insufficient documentation

## 2014-03-23 DIAGNOSIS — I7300 Raynaud's syndrome without gangrene: Secondary | ICD-10-CM | POA: Diagnosis not present

## 2014-03-23 DIAGNOSIS — G43909 Migraine, unspecified, not intractable, without status migrainosus: Secondary | ICD-10-CM | POA: Insufficient documentation

## 2014-03-23 DIAGNOSIS — F17210 Nicotine dependence, cigarettes, uncomplicated: Secondary | ICD-10-CM | POA: Diagnosis not present

## 2014-03-23 DIAGNOSIS — Z79899 Other long term (current) drug therapy: Secondary | ICD-10-CM | POA: Insufficient documentation

## 2014-03-23 DIAGNOSIS — S61259A Open bite of unspecified finger without damage to nail, initial encounter: Secondary | ICD-10-CM

## 2014-03-23 DIAGNOSIS — S61452A Open bite of left hand, initial encounter: Secondary | ICD-10-CM

## 2014-03-23 HISTORY — DX: Other specified postprocedural states: Z98.890

## 2014-03-23 HISTORY — DX: Adverse effect of unspecified anesthetic, initial encounter: T41.45XA

## 2014-03-23 HISTORY — DX: Other complications of anesthesia, initial encounter: T88.59XA

## 2014-03-23 HISTORY — PX: I&D EXTREMITY: SHX5045

## 2014-03-23 HISTORY — DX: Other specified postprocedural states: R11.2

## 2014-03-23 SURGERY — IRRIGATION AND DEBRIDEMENT EXTREMITY
Anesthesia: General | Site: Finger | Laterality: Left

## 2014-03-23 MED ORDER — FENTANYL CITRATE 0.05 MG/ML IJ SOLN: 25.0000 ug | INTRAMUSCULAR | Status: DC | PRN

## 2014-03-23 MED ORDER — PROPOFOL 10 MG/ML IV BOLUS
INTRAVENOUS | Status: AC
  Filled 2014-03-23: qty 20

## 2014-03-23 MED ORDER — ONDANSETRON HCL 4 MG/2ML IJ SOLN
INTRAMUSCULAR | Status: AC
  Filled 2014-03-23: qty 2

## 2014-03-23 MED ORDER — ATROPINE SULFATE 0.4 MG/ML IJ SOLN
INTRAMUSCULAR | Status: AC
  Filled 2014-03-23: qty 1

## 2014-03-23 MED ORDER — SCOPOLAMINE 1 MG/3DAYS TD PT72
MEDICATED_PATCH | TRANSDERMAL | Status: AC
  Filled 2014-03-23: qty 1

## 2014-03-23 MED ORDER — CLINDAMYCIN PHOSPHATE 900 MG/50ML IV SOLN
INTRAVENOUS | Status: DC | PRN
  Administered 2014-03-23: 900 mg via INTRAVENOUS

## 2014-03-23 MED ORDER — LACTATED RINGERS IV SOLN
INTRAVENOUS | Status: DC
  Administered 2014-03-23: 1000 mL via INTRAVENOUS

## 2014-03-23 MED ORDER — EPHEDRINE SULFATE 50 MG/ML IJ SOLN
INTRAMUSCULAR | Status: DC | PRN
  Administered 2014-03-23: 10 mg via INTRAVENOUS

## 2014-03-23 MED ORDER — ONDANSETRON HCL 4 MG/2ML IJ SOLN: 4.0000 mg | Freq: Once | INTRAMUSCULAR | Status: DC | PRN

## 2014-03-23 MED ORDER — ONDANSETRON HCL 4 MG/2ML IJ SOLN
INTRAMUSCULAR | Status: DC | PRN
  Administered 2014-03-23 (×2): 2 mg via INTRAVENOUS

## 2014-03-23 MED ORDER — LIDOCAINE HCL (CARDIAC) 20 MG/ML IV SOLN
INTRAVENOUS | Status: AC
  Filled 2014-03-23: qty 5

## 2014-03-23 MED ORDER — DEXAMETHASONE SODIUM PHOSPHATE 10 MG/ML IJ SOLN
INTRAMUSCULAR | Status: AC
  Filled 2014-03-23: qty 1

## 2014-03-23 MED ORDER — FENTANYL CITRATE 0.05 MG/ML IJ SOLN
INTRAMUSCULAR | Status: AC
Start: 2014-03-23 — End: 2014-03-23
  Filled 2014-03-23: qty 5

## 2014-03-23 MED ORDER — KETOROLAC TROMETHAMINE 30 MG/ML IJ SOLN: 30.0000 mg | Freq: Once | INTRAMUSCULAR | Status: DC | PRN

## 2014-03-23 MED ORDER — BUPIVACAINE HCL (PF) 0.5 % IJ SOLN
INTRAMUSCULAR | Status: AC
  Filled 2014-03-23: qty 30

## 2014-03-23 MED ORDER — 0.9 % SODIUM CHLORIDE (POUR BTL) OPTIME
TOPICAL | Status: DC | PRN
  Administered 2014-03-23 (×3): 1000 mL

## 2014-03-23 MED ORDER — PROPOFOL 10 MG/ML IV BOLUS
INTRAVENOUS | Status: DC | PRN
  Administered 2014-03-23: 150 mg via INTRAVENOUS
  Administered 2014-03-23: 50 mg via INTRAVENOUS

## 2014-03-23 MED ORDER — CLINDAMYCIN PHOSPHATE 900 MG/50ML IV SOLN
INTRAVENOUS | Status: AC
  Filled 2014-03-23: qty 50

## 2014-03-23 MED ORDER — CHLORHEXIDINE GLUCONATE 4 % EX LIQD
60.0000 mL | Freq: Once | CUTANEOUS | Status: AC
  Administered 2014-03-23: 4 via TOPICAL

## 2014-03-23 MED ORDER — MIDAZOLAM HCL 2 MG/2ML IJ SOLN
INTRAMUSCULAR | Status: AC
  Filled 2014-03-23: qty 2

## 2014-03-23 MED ORDER — SODIUM CHLORIDE 0.9 % IJ SOLN
INTRAMUSCULAR | Status: AC
  Filled 2014-03-23: qty 10

## 2014-03-23 MED ORDER — LIDOCAINE HCL (CARDIAC) 20 MG/ML IV SOLN
INTRAVENOUS | Status: DC | PRN
  Administered 2014-03-23: 75 mg via INTRAVENOUS

## 2014-03-23 MED ORDER — SCOPOLAMINE 1 MG/3DAYS TD PT72
1.0000 | MEDICATED_PATCH | TRANSDERMAL | Status: DC
  Administered 2014-03-23: 1 via TRANSDERMAL
  Administered 2014-03-23: 1.5 mg via TRANSDERMAL
  Filled 2014-03-23: qty 1

## 2014-03-23 MED ORDER — MIDAZOLAM HCL 5 MG/5ML IJ SOLN
INTRAMUSCULAR | Status: DC | PRN
  Administered 2014-03-23 (×2): 1 mg via INTRAVENOUS

## 2014-03-23 MED ORDER — METOPROLOL SUCCINATE ER 25 MG PO TB24
25.0000 mg | ORAL_TABLET | ORAL | Status: AC
  Administered 2014-03-23: 25 mg via ORAL
  Filled 2014-03-23: qty 1

## 2014-03-23 MED ORDER — OXYCODONE HCL 5 MG PO TABS: 5.0000 mg | ORAL_TABLET | ORAL | Status: DC | PRN

## 2014-03-23 MED ORDER — MEPERIDINE HCL 50 MG/ML IJ SOLN: 6.2500 mg | INTRAMUSCULAR | Status: DC | PRN

## 2014-03-23 MED ORDER — FENTANYL CITRATE 0.05 MG/ML IJ SOLN
INTRAMUSCULAR | Status: DC | PRN
  Administered 2014-03-23 (×2): 50 ug via INTRAVENOUS

## 2014-03-23 MED ORDER — EPHEDRINE SULFATE 50 MG/ML IJ SOLN
INTRAMUSCULAR | Status: AC
  Filled 2014-03-23: qty 1

## 2014-03-23 SURGICAL SUPPLY — 32 items
BAG ZIPLOCK 12X15 (MISCELLANEOUS) IMPLANT
BANDAGE ELASTIC 4 VELCRO ST LF (GAUZE/BANDAGES/DRESSINGS) IMPLANT
BLADE SURG SZ10 CARB STEEL (BLADE) ×2 IMPLANT
BNDG COHESIVE 1X5 TAN STRL LF (GAUZE/BANDAGES/DRESSINGS) ×2 IMPLANT
BNDG CONFORM 2 STRL LF (GAUZE/BANDAGES/DRESSINGS) ×2 IMPLANT
BNDG GAUZE ELAST 4 BULKY (GAUZE/BANDAGES/DRESSINGS) ×2 IMPLANT
CUFF TOURN SGL QUICK 18 (TOURNIQUET CUFF) ×2 IMPLANT
DRAIN PENROSE 18X1/2 LTX STRL (DRAIN) IMPLANT
DRAPE SURG 17X11 SM STRL (DRAPES) ×4 IMPLANT
DRSG EMULSION OIL 3X3 NADH (GAUZE/BANDAGES/DRESSINGS) IMPLANT
DRSG PAD ABDOMINAL 8X10 ST (GAUZE/BANDAGES/DRESSINGS) IMPLANT
ELECT REM PT RETURN 9FT ADLT (ELECTROSURGICAL)
ELECTRODE REM PT RTRN 9FT ADLT (ELECTROSURGICAL) IMPLANT
GAUZE SPONGE 4X4 12PLY STRL (GAUZE/BANDAGES/DRESSINGS) ×2 IMPLANT
GAUZE XEROFORM 1X8 LF (GAUZE/BANDAGES/DRESSINGS) ×2 IMPLANT
GLOVE SURG ORTHO 8.0 STRL STRW (GLOVE) ×2 IMPLANT
GOWN STRL REUS W/TWL XL LVL3 (GOWN DISPOSABLE) ×2 IMPLANT
KIT BASIN OR (CUSTOM PROCEDURE TRAY) ×2 IMPLANT
MANIFOLD NEPTUNE II (INSTRUMENTS) ×2 IMPLANT
PACK ORTHO EXTREMITY (CUSTOM PROCEDURE TRAY) ×2 IMPLANT
PAD CAST 4YDX4 CTTN HI CHSV (CAST SUPPLIES) IMPLANT
PADDING CAST COTTON 4X4 STRL (CAST SUPPLIES)
POSITIONER SURGICAL ARM (MISCELLANEOUS) ×2 IMPLANT
SOL PREP POV-IOD 4OZ 10% (MISCELLANEOUS) ×2 IMPLANT
SOL PREP PROV IODINE SCRUB 4OZ (MISCELLANEOUS) ×2 IMPLANT
SUT PROLENE 3 0 PS 2 (SUTURE) ×2 IMPLANT
SUT VIC AB 1 CT1 27 (SUTURE)
SUT VIC AB 1 CT1 27XBRD ANTBC (SUTURE) IMPLANT
SUT VIC AB 2-0 CT1 27 (SUTURE)
SUT VIC AB 2-0 CT1 27XBRD (SUTURE) IMPLANT
SYR 20CC LL (SYRINGE) IMPLANT
TOWEL OR 17X26 10 PK STRL BLUE (TOWEL DISPOSABLE) ×2 IMPLANT

## 2014-03-23 NOTE — Discharge Instructions (Signed)
[facility] CALL OFFICE FOR F/U APPT [phone] ON Monday 03/25/2104 AT 4 PM ALSO HAVE THERAPY APPT TO FOLLOW KEEP HAND ELEVATED ABOVE HEART OK TO APPLY ICE TO OPERATIVE AREA CONTACT OFFICE IF ANY WORSENING PAIN OR CONCERNS.

## 2014-03-23 NOTE — Anesthesia Postprocedure Evaluation (Signed)
Anesthesia Post Note  Patient: Sheryl Parker  Procedure(s) Performed: Procedure(s) (LRB): IRRIGATION AND DEBRIDEMENT LEFT FINGER (Left)  Anesthesia type: General  Patient location: PACU  Post pain: Pain level controlled  Post assessment: Post-op Vital signs reviewed  Last Vitals:  Filed Vitals:   03/23/14 1755  BP: 164/94  Pulse: 62  Temp:   Resp: 14    Post vital signs: Reviewed  Level of consciousness: sedated  Complications: No apparent anesthesia complications

## 2014-03-23 NOTE — Transfer of Care (Signed)
Immediate Anesthesia Transfer of Care Note  Patient: Sheryl Parker  Procedure(s) Performed: Procedure(s): IRRIGATION AND DEBRIDEMENT LEFT FINGER (Left)  Patient Location: PACU  Anesthesia Type:General  Level of Consciousness: awake, alert , oriented and patient cooperative  Airway & Oxygen Therapy: Patient Spontanous Breathing and Patient connected to face mask oxygen  Post-op Assessment: Report given to RN, Post -op Vital signs reviewed and stable and Patient moving all extremities  Post vital signs: Reviewed and stable  Last Vitals:  Filed Vitals:   03/23/14 1508  BP: 166/96  Pulse: 70  Temp: 36.7 C  Resp: 18    Complications: No apparent anesthesia complications

## 2014-03-23 NOTE — Anesthesia Preprocedure Evaluation (Signed)
Anesthesia Evaluation  Patient identified by MRN, date of birth, ID band Patient awake    Reviewed: Allergy & Precautions, H&P , NPO status , Patient's Chart, lab work & pertinent test results, reviewed documented beta blocker date and time   Airway Mallampati: II  TM Distance: >3 FB Neck ROM: full    Dental no notable dental hx.    Pulmonary Current Smoker,  breath sounds clear to auscultation  Pulmonary exam normal       Cardiovascular Exercise Tolerance: Good hypertension, Pt. on home beta blockers Rhythm:regular Rate:Normal     Neuro/Psych    GI/Hepatic negative GI ROS, Neg liver ROS,   Endo/Other  negative endocrine ROS  Renal/GU negative Renal ROS  negative genitourinary   Musculoskeletal   Abdominal Normal abdominal exam  (+)   Peds  Hematology negative hematology ROS (+)   Anesthesia Other Findings   Reproductive/Obstetrics negative OB ROS                             Anesthesia Physical Anesthesia Plan  ASA: II  Anesthesia Plan: General   Post-op Pain Management:    Induction: Intravenous  Airway Management Planned: LMA  Additional Equipment:   Intra-op Plan:   Post-operative Plan:   Informed Consent: I have reviewed the patients History and Physical, chart, labs and discussed the procedure including the risks, benefits and alternatives for the proposed anesthesia with the patient or authorized representative who has indicated his/her understanding and acceptance.     Plan Discussed with: CRNA and Surgeon  Anesthesia Plan Comments:         Anesthesia Quick Evaluation

## 2014-03-23 NOTE — Brief Op Note (Signed)
03/23/2014  4:22 PM  PATIENT:  Sheryl BurnerPatricia F Parker  64 y.o. female  PRE-OPERATIVE DIAGNOSIS:  infected left small finger  POST-OPERATIVE DIAGNOSIS:  infected left small finger  PROCEDURE:  Procedure(s): IRRIGATION AND DEBRIDEMENT LEFT FINGER (Left)  SURGEON:  Surgeon(s) and Role:    * Bradly BienenstockFred Ema Hebner, MD - Primary  PHYSICIAN ASSISTANT:   ASSISTANTS: none   ANESTHESIA:   general  EBL:     BLOOD ADMINISTERED:none  DRAINS: none   LOCAL MEDICATIONS USED:  NONE  SPECIMEN:  No Specimen  DISPOSITION OF SPECIMEN:  N/A  COUNTS:  YES  TOURNIQUET:    DICTATIO: [id number]: [id number]  PLAN OF CARE: Discharge to home after PACU  PATIENT DISPOSITION:  PACU - hemodynamically stable.   Delay start of Pharmacological VTE agent (>24hrs) due to surgical blood loss or risk of bleeding: not applicable

## 2014-03-23 NOTE — H&P (Signed)
Sheryl Parker is an 64 y.o. female.   Chief Complaint: left small finger infection HPI: pt seen evaluated in office Pt with dog bite and bad infection to dorsum of small finger Pt here for surgery No prior surgery to left small finger  Past Medical History  Diagnosis Date  . Hypertension   . Arthritis   . Osteoporosis   . Fibromyalgia   . Disc degeneration   . Attention deficit     Dr. Lattie Haw [place]  . Raynaud disease   . Chronic back pain     hx chronic narcotics- Dr in [place] recent  meds since 2 yrs ago  . Migraine   . IBS (irritable bowel syndrome)   . Diverticulitis   . Complication of anesthesia   . PONV (postoperative nausea and vomiting)     Past Surgical History  Procedure Laterality Date  . Oophorectomy  2002    right  . Tubal ligation      Family History  Problem Relation Age of Onset  . Colon polyps Brother   . Cirrhosis Brother     etoh  . Breast cancer Sister   . Liver disease Son     Hepatitis C   Social History:  reports that she has been smoking Cigarettes.  She has a 1.6 pack-year smoking history. She does not have any smokeless tobacco history on file. She reports that she does not drink alcohol or use illicit drugs.  Allergies:  Allergies  Allergen Reactions  . Penicillins Anaphylaxis  . Sulfa Drugs Cross Reactors Anaphylaxis  . Codeine Nausea And Vomiting  . Levofloxacin Other (See Comments)    Severe sweating and dhydration   . Prednisone Other (See Comments)    Visual disturbances and migraines     Medications Prior to Admission  Medication Sig Dispense Refill  . ALPRAZolam (XANAX) 1 MG tablet Take 1 tablet by mouth 3 (three) times daily as needed.  1  . amphetamine-dextroamphetamine (ADDERALL) 20 MG tablet Take 20 mg by mouth 2 (two) times daily.    . ARIPiprazole (ABILIFY) 5 MG tablet Take 2.5-5 mg by mouth daily.    . Cholecalciferol (VITAMIN D) 2000 UNITS CAPS Take 1 capsule by mouth daily.    Marland Kitchen FLUoxetine  (PROZAC) 20 MG tablet Take 40 mg by mouth daily.    Marland Kitchen ibuprofen (ADVIL,MOTRIN) 200 MG tablet Take 200 mg by mouth every 6 (six) hours as needed for moderate pain.    Marland Kitchen lidocaine (LIDODERM) 5 % Place 1 patch onto the skin daily as needed. Remove & Discard patch within 12 hours or as directed by MD For fibromyalgia    . metoprolol succinate (TOPROL-XL) 25 MG 24 hr tablet Take 25 mg by mouth daily.    . Multiple Vitamin (MULITIVITAMIN WITH MINERALS) TABS Take 1 tablet by mouth daily.    . pantoprazole (PROTONIX) 40 MG tablet Take 40 mg by mouth daily.    . promethazine (PHENERGAN) 25 MG tablet Take 25 mg by mouth every 8 (eight) hours as needed. For nausea.    . rizatriptan (MAXALT) 10 MG tablet Take 10 mg by mouth as needed. May repeat in 2 hours if needed for migraines.    Marland Kitchen telmisartan (MICARDIS) 80 MG tablet Take 80 mg by mouth daily.    . temazepam (RESTORIL) 22.5 MG capsule Take 1 capsule by mouth at bedtime.  0  . clonazePAM (KLONOPIN) 0.5 MG tablet Take 0.5 mg by mouth 3 (three) times daily as needed. For anxiety.    Marland Kitchen  hydrOXYzine (VISTARIL) 25 MG capsule Take 1 capsule (25 mg total) by mouth 3 (three) times daily as needed for anxiety. (Patient not taking: Reported on 03/21/2014) 90 capsule 0  . ondansetron (ZOFRAN) 4 MG tablet Take 1 tablet (4 mg total) by mouth every 6 (six) hours. Use for nausea if needed. (Patient not taking: Reported on 03/23/2014) 12 tablet 0  . oxyCODONE-acetaminophen (PERCOCET/ROXICET) 5-325 MG per tablet Take 1 tablet by mouth every 6 (six) hours as needed. (Patient not taking: Reported on 03/23/2014) 20 tablet 0    Results for orders placed or performed during the hospital encounter of 03/21/14 (from the past 48 hour(s))  CBC with Differential     Status: Abnormal   Collection Time: 03/21/14  6:07 PM  Result Value Ref Range   WBC 12.1 (H) 4.0 - 10.5 K/uL   RBC 4.43 3.87 - 5.11 MIL/uL   Hemoglobin 14.4 12.0 - 15.0 g/dL   HCT 42.7 36.0 - 46.0 %   MCV 96.4 78.0 -  100.0 fL   MCH 32.5 26.0 - 34.0 pg   MCHC 33.7 30.0 - 36.0 g/dL   RDW 13.4 11.5 - 15.5 %   Platelets 385 150 - 400 K/uL   Neutrophils Relative % 70 43 - 77 %   Neutro Abs 8.4 (H) 1.7 - 7.7 K/uL   Lymphocytes Relative 24 12 - 46 %   Lymphs Abs 2.9 0.7 - 4.0 K/uL   Monocytes Relative 5 3 - 12 %   Monocytes Absolute 0.6 0.1 - 1.0 K/uL   Eosinophils Relative 1 0 - 5 %   Eosinophils Absolute 0.2 0.0 - 0.7 K/uL   Basophils Relative 0 0 - 1 %   Basophils Absolute 0.0 0.0 - 0.1 K/uL  Basic metabolic panel     Status: Abnormal   Collection Time: 03/21/14  6:07 PM  Result Value Ref Range   Sodium 141 135 - 145 mmol/L   Potassium 4.3 3.5 - 5.1 mmol/L   Chloride 104 96 - 112 mmol/L   CO2 27 19 - 32 mmol/L   Glucose, Bld 153 (H) 70 - 99 mg/dL   BUN 11 6 - 23 mg/dL   Creatinine, Ser 0.89 0.50 - 1.10 mg/dL   Calcium 9.9 8.4 - 10.5 mg/dL   GFR calc non Af Amer 68 (L) >90 mL/min   GFR calc Af Amer 79 (L) >90 mL/min    Comment: (NOTE) The eGFR has been calculated using the CKD EPI equation. This calculation has not been validated in all clinical situations. eGFR's persistently <90 mL/min signify possible Chronic Kidney Disease.    Anion gap 10 5 - 15   Dg Hand Complete Left  03/21/2014   CLINICAL DATA:  LEFT hand pain and swelling with redness at fifth digit, bitten by a dog on 01/14/2014, never healed, gradually began to swell, history hypertension, Raynaud's  EXAM: LEFT HAND - COMPLETE 3+ VIEW  COMPARISON:  01/24/2014  FINDINGS: Soft tissue swelling little finger.  Significant PIP joint space narrowing little finger and new bone destruction at the head of the proximal phalanx.  Additionally, bone destruction at the base of the middle phalanx.  Findings are highly concerning for septic arthritis and osteomyelitis.  Diffuse osseous demineralization.  No acute fracture, dislocation, or additional bone destruction.  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  Findings called to Lily Kocher PA on 03/21/2014 at 1808 hours.   Electronically Signed   By: Lavonia Dana M.D.   On: 03/21/2014 18:10    [facility] RECENT ILLNESSES OR HOSPITALIZATIONS  Blood pressure 166/96, pulse 70, temperature 98.1 F (36.7 C), temperature source Oral, resp. rate 18, height _0  (1.626 m), weight 72.938 kg (160 lb 12.8 oz), SpO2 99 %. Physical Exam  General Appearance:  Alert, cooperative, no distress, appears stated age  Head:  Normocephalic, without obvious abnormality, atraumatic  Eyes:  Pupils equal, conjunctiva/corneas clear,         Throat: Lips, mucosa, and tongue normal; teeth and gums normal  Neck: No visible masses     Lungs:   respirations unlabored  Chest Wall:  No tenderness or deformity  Heart:  Regular rate and rhythm,  Abdomen:   Soft, non-tender,         Extremities: LEFT HAND: FINGER IN BANDAGE, PT SEEN IN OFFICE THIS AM FINGER TIP WARM WELL PERFUSED LIMITED MOBILITY TO SMALL FINGER GOOD MOBILITY TO INDEX/LONG/RING AND THUMB  Pulses: 2+ and symmetric  Skin: Skin color, texture, turgor normal, no rashes or lesions     Neurologic: Normal   Assessment/Plan LEFT SMALL FINGER ABSCESS FROM DOG BITE  LEFT SMALL FINGER INCISION AND DRAINAGE  R/B/A DISCUSSED WITH PT IN OFFICE.  PT VOICED UNDERSTANDING OF PLAN CONSENT SIGNED DAY OF SURGERY PT SEEN AND EXAMINED PRIOR TO OPERATIVE PROCEDURE/DAY OF SURGERY SITE MARKED. QUESTIONS ANSWERED WILL GO HOME FOLLOWING SURGERY WE ARE PLANNING SURGERY FOR YOUR UPPER EXTREMITY. THE RISKS AND BENEFITS OF SURGERY INCLUDE BUT NOT LIMITED TO BLEEDING INFECTION, DAMAGE TO NEARBY NERVES ARTERIES TENDONS, FAILURE OF SURGERY TO ACCOMPLISH ITS INTENDED GOALS, PERSISTENT SYMPTOMS AND NEED FOR FURTHER SURGICAL  INTERVENTION. WITH THIS IN MIND WE WILL PROCEED. I HAVE DISCUSSED WITH THE PATIENT THE PRE AND POSTOPERATIVE REGIMEN AND THE DOS AND DON'TS. PT VOICED UNDERSTANDING AND INFORMED CONSENT SIGNED.  Linna Hoff 03/23/2014, 4:20 PM

## 2014-03-24 NOTE — Op Note (Signed)
NAME:  [name], Sheryl Parker               ACCOUNT NO.:  [id number]  MEDICAL RECORD NO.:  [id number]  LOCATION:  [facility]                         [facility]  PHYSICIAN:  Madelynn DoneFred W Lailoni Baquera IV, MD  DATE OF BIRTH:  [birth year]  DATE OF PROCEDURE:  03/23/2014 DATE OF DISCHARGE:  03/23/2014                              OPERATIVE REPORT   PREOPERATIVE DIAGNOSIS:  Left small finger infection from dog bite.  POSTOPERATIVE DIAGNOSIS:  Left small finger infection from dog bite.  SURGEON:  Madelynn DoneFred W Braydn Carneiro IV, MD, who has scrubbed and was present for the entire procedure.  ASSISTANT SURGEON:  None.  ANESTHESIA:  General via LMA.  SURGICAL PROCEDURES: 1. Left small finger incision and drainage of a deep abscess. 2. Left small finger extensor tenosynovectomy.  SURGICAL INDICATIONS:  [name] is a right-hand dominant female who presented to the office with a very bad infection over the dorsal aspect of the left small finger.  The patient was seen and evaluated and recommended to undergo the above procedure.  Risks, benefits, and alternatives were discussed in detail with the patient.  Signed informed consent was obtained.  Risks include, but not limited to, bleeding; infection; damage to nearby nerves, arteries, or tendons; loss of motion of the wrist and digits; incomplete relief of symptoms; and need for further surgical intervention.  DESCRIPTION OF PROCEDURE:  The patient was properly identified in the preoperative holding area and marked with a permanent marker made on the left small finger to indicate the correct operative site.  The patient was then brought back to the operating room, placed supine on the anesthesia room table where the general anesthetic was administered. The patient tolerated this well.  A well-padded tourniquet was placed on the left forearm and sealed with 1000 drape.  Left upper extremity was then prepped and draped in normal sterile fashion.  Time-out was  called. Correct site was identified, procedure then begun.  Attention was then turned to the left small finger where a curvilinear incision made directly over the dorsal aspect of the small finger.  Deep dissection carried down through skin and subcutaneous tissue where the abscess cavity was then identified.  Wound cultures were then taken.  The patient then received antibiotics and waited until her cultures were taken.  After incision and drainage of the area of the extensor tendons, the patient did have the inflammatory and infectious tissue along the course of the extensor tendons.  Extensor tenosynovectomy was then carried out along the course of the tendon dorsally.  Copious wound irrigation done throughout.  The wound was then mainly left opened.  Two tacking sutures were then used to loosely reapproximate the skin edges. Adaptic, Xeroform dressing, and sterile compressive bandage then applied.  The patient tolerated the procedure well, returned to the recovery room in good condition.  POSTPROCEDURE PLAN:  The patient was discharged home, seen back in the office in approximately 2 days for wound check, full wound care, and begin an outpatient therapy regimen, oral antibiotics as that she has been prescribed.     Madelynn DoneFred W Joseeduardo Brix IV, MD     FWO/MEDQ  D:  03/23/2014  T:  03/24/2014  Job:  [id number] 

## 2014-03-26 ENCOUNTER — Encounter (HOSPITAL_COMMUNITY): Payer: Self-pay | Admitting: Orthopedic Surgery

## 2014-03-26 LAB — WOUND CULTURE
CULTURE: NO GROWTH
Gram Stain: NONE SEEN

## 2014-03-28 LAB — ANAEROBIC CULTURE: GRAM STAIN: NONE SEEN

## 2014-06-14 ENCOUNTER — Emergency Department (HOSPITAL_COMMUNITY)
Admission: EM | Admit: 2014-06-14 | Discharge: 2014-06-14 | Disposition: A | Discharge status: Home / Self Care | Payer: Medicare Other | Attending: Emergency Medicine | Admitting: Emergency Medicine

## 2014-06-14 ENCOUNTER — Encounter (HOSPITAL_COMMUNITY): Payer: Self-pay

## 2014-06-14 DIAGNOSIS — M797 Fibromyalgia: Secondary | ICD-10-CM | POA: Insufficient documentation

## 2014-06-14 DIAGNOSIS — Z79899 Other long term (current) drug therapy: Secondary | ICD-10-CM | POA: Insufficient documentation

## 2014-06-14 DIAGNOSIS — Z720 Tobacco use: Secondary | ICD-10-CM | POA: Insufficient documentation

## 2014-06-14 DIAGNOSIS — I10 Essential (primary) hypertension: Secondary | ICD-10-CM | POA: Diagnosis not present

## 2014-06-14 DIAGNOSIS — R29810 Facial weakness: Secondary | ICD-10-CM | POA: Diagnosis present

## 2014-06-14 DIAGNOSIS — G8929 Other chronic pain: Secondary | ICD-10-CM | POA: Insufficient documentation

## 2014-06-14 DIAGNOSIS — F909 Attention-deficit hyperactivity disorder, unspecified type: Secondary | ICD-10-CM | POA: Insufficient documentation

## 2014-06-14 DIAGNOSIS — G43909 Migraine, unspecified, not intractable, without status migrainosus: Secondary | ICD-10-CM | POA: Diagnosis not present

## 2014-06-14 DIAGNOSIS — Z880 Allergy status to penicillin: Secondary | ICD-10-CM | POA: Diagnosis not present

## 2014-06-14 DIAGNOSIS — G510 Bell's palsy: Secondary | ICD-10-CM

## 2014-06-14 DIAGNOSIS — Z8719 Personal history of other diseases of the digestive system: Secondary | ICD-10-CM | POA: Diagnosis not present

## 2014-06-14 MED ORDER — PREDNISONE 50 MG PO TABS
60.0000 mg | ORAL_TABLET | Freq: Once | ORAL | Status: DC
  Filled 2014-06-14 (×2): qty 1

## 2014-06-14 MED ORDER — PREDNISONE 20 MG PO TABS: 60.0000 mg | ORAL_TABLET | Freq: Every day | ORAL | Status: DC

## 2014-06-14 NOTE — ED Notes (Signed)
Complain of facial droop to left side of face that started yesterday. Also states blood pressure has been up

## 2014-06-14 NOTE — ED Provider Notes (Signed)
CSN: [id number]     Arrival date & time 06/14/14  1309 History   First MD Initiated Contact with Patient 06/14/14 1427     Chief Complaint  Patient presents with  . Facial Droop     HPI Patient reports left-sided facial droop and abnormal sensations in the left side of her tongue since yesterday.  She denies weakness of her arms or legs.  She's had some tearing from her left eye.  No recent illness or infection.  She was told to come the emergency department for the possibility of stroke.  No chest pain or shortness of breath.  No abdominal pain.  No paresthesias of her arms or legs.  No prior history of stroke.   Past Medical History  Diagnosis Date  . Hypertension   . Arthritis   . Osteoporosis   . Fibromyalgia   . Disc degeneration   . Attention deficit     Dr. Misty Stanley [place]  . Raynaud disease   . Chronic back pain     hx chronic narcotics- Dr in [place] recent  meds since 2 yrs ago  . Migraine   . IBS (irritable bowel syndrome)   . Diverticulitis   . Complication of anesthesia   . PONV (postoperative nausea and vomiting)    Past Surgical History  Procedure Laterality Date  . Oophorectomy  2002    right  . Tubal ligation    . I&d extremity Left 03/23/2014    Procedure: IRRIGATION AND DEBRIDEMENT LEFT FINGER;  Surgeon: Bradly Bienenstock, MD;  Location: [facility];  Service: Orthopedics;  Laterality: Left;   Family History  Problem Relation Age of Onset  . Colon polyps Brother   . Cirrhosis Brother     etoh  . Breast cancer Sister   . Liver disease Son     Hepatitis C   History  Substance Use Topics  . Smoking status: Current Some Day Smoker -- 0.20 packs/day for 8 years    Types: Cigarettes  . Smokeless tobacco: Not on file     Comment: 1-3 cigarettes  . Alcohol Use: No     Comment: HEAVY ETOH X 25 YRS, 8 YRS AGO   OB History    No data available     Review of Systems  All other systems reviewed and are negative.     Allergies  Penicillins;  Sulfa drugs cross reactors; Codeine; Levofloxacin; and Prednisone  Home Medications   Prior to Admission medications   Medication Sig Start Date End Date Taking? Authorizing Provider  ALPRAZolam Prudy Feeler) 1 MG tablet Take 1 tablet by mouth 3 (three) times daily as needed. 01/31/14  Yes Historical Provider, MD  amphetamine-dextroamphetamine (ADDERALL) 20 MG tablet Take 20 mg by mouth 2 (two) times daily.   Yes Historical Provider, MD  ARIPiprazole (ABILIFY) 5 MG tablet Take 2.5-5 mg by mouth daily.   Yes Historical Provider, MD  Cholecalciferol (VITAMIN D) 2000 UNITS CAPS Take 1 capsule by mouth daily.   Yes Historical Provider, MD  FLUoxetine (PROZAC) 20 MG tablet Take 40 mg by mouth daily.   Yes Historical Provider, MD  ibuprofen (ADVIL,MOTRIN) 200 MG tablet Take 200 mg by mouth every 6 (six) hours as needed for moderate pain.   Yes Historical Provider, MD  lidocaine (LIDODERM) 5 % Place 1 patch onto the skin daily as needed. Remove & Discard patch within 12 hours or as directed by MD For fibromyalgia   Yes Historical Provider, MD  metoprolol succinate (TOPROL-XL) 25 MG  24 hr tablet Take 25 mg by mouth daily.   Yes Historical Provider, MD  Multiple Vitamin (MULITIVITAMIN WITH MINERALS) TABS Take 1 tablet by mouth daily.   Yes Historical Provider, MD  pantoprazole (PROTONIX) 40 MG tablet Take 40 mg by mouth daily.   Yes Historical Provider, MD  promethazine (PHENERGAN) 25 MG tablet Take 25 mg by mouth every 8 (eight) hours as needed. For nausea.   Yes Historical Provider, MD  rizatriptan (MAXALT) 10 MG tablet Take 10 mg by mouth as needed. May repeat in 2 hours if needed for migraines.   Yes Historical Provider, MD  telmisartan (MICARDIS) 80 MG tablet Take 80 mg by mouth daily.   Yes Historical Provider, MD  temazepam (RESTORIL) 22.5 MG capsule Take 1 capsule by mouth at bedtime. 02/22/14  Yes Historical Provider, MD  hydrOXYzine (VISTARIL) 25 MG capsule Take 1 capsule (25 mg total) by mouth 3  (three) times daily as needed for anxiety. Patient not taking: Reported on 03/21/2014 01/30/13   Nelly Rout, MD  predniSONE (DELTASONE) 20 MG tablet Take 3 tablets (60 mg total) by mouth daily. 06/14/14   Azalia Bilis, MD   BP 159/81 mmHg  Pulse 63  Temp(Src) 97.4 F (36.3 C)  Resp 16  Ht 5' 3.5" (1.613 m)  Wt 160 lb (72.576 kg)  BMI 27.89 kg/m2  SpO2 97% Physical Exam  Constitutional: She is oriented to person, place, and time. She appears well-developed and well-nourished. No distress.  HENT:  Weakness of the left side of her face including weakness of the muscles of her left forehead.  Small amount of tearing noted from the left eye.  Eyes: EOM are normal.  Neck: Normal range of motion.  Cardiovascular: Normal rate, regular rhythm and normal heart sounds.   Pulmonary/Chest: Effort normal and breath sounds normal.  Abdominal: Soft. She exhibits no distension. There is no tenderness.  Musculoskeletal: Normal range of motion.  5 out of 5 strength of major muscle groups of bilateral arms and legs  Neurological: She is alert and oriented to person, place, and time.  Skin: Skin is warm and dry.  Psychiatric: She has a normal mood and affect. Judgment normal.  Nursing note and vitals reviewed.   ED Course  Procedures (including critical care time) Labs Review Labs Reviewed - No data to display  Imaging Review No results found.   EKG Interpretation None      MDM   Final diagnoses:  Bell's palsy    Patient be started on steroids.  Doubt stroke.  Weakness of the left forehead  I will withhold antivirals based on the American Academy of neurology summary of evidence based guidelines for steroids and antivirals for Bell's palsy, 2012    Azalia Bilis, MD 06/14/14 1459

## 2014-06-14 NOTE — ED Notes (Signed)
Pt made aware to return if symptoms worsen or if any life threatening symptoms occur.   

## 2014-06-14 NOTE — Discharge Instructions (Signed)
Bell's Palsy °Bell's palsy is a condition in which the muscles on one side of the face cannot move (paralysis). This is because the nerves in the face are paralyzed. It is most often thought to be caused by a virus. The virus causes swelling of the nerve that controls movement on one side of the face. The nerve travels through a tight space surrounded by bone. When the nerve swells, it can be compressed by the bone. This results in damage to the protective covering around the nerve. This damage interferes with how the nerve communicates with the muscles of the face. As a result, it can cause weakness or paralysis of the facial muscles.  °Injury (trauma), tumor, and surgery may cause Bell's palsy, but most of the time the cause is unknown. It is a relatively common condition. It starts suddenly (abrupt onset) with the paralysis usually ending within 2 days. Bell's palsy is not dangerous. But because the eye does not close properly, you may need care to keep the eye from getting dry. This can include splinting (to keep the eye shut) or moistening with artificial tears. Bell's palsy very seldom occurs on both sides of the face at the same time. °SYMPTOMS  °· Eyebrow sagging. °· Drooping of the eyelid and corner of the mouth. °· Inability to close one eye. °· Loss of taste on the front of the tongue. °· Sensitivity to loud noises. °TREATMENT  °The treatment is usually non-surgical. If the patient is seen within the first 24 to 48 hours, a short course of steroids may be prescribed, in an attempt to shorten the length of the condition. Antiviral medicines may also be used with the steroids, but it is unclear if they are helpful.  °You will need to protect your eye, if you cannot close it. The cornea (clear covering over your eye) will become dry and can be damaged. Artificial tears can be used to keep your eye moist. Glasses or an eye patch should be worn to protect your eye. °PROGNOSIS  °Recovery is variable, ranging  from days to months. Although the problem usually goes away completely (about 80% of cases resolve), predicting the outcome is impossible. Most people improve within 3 weeks of when the symptoms began. Improvement may continue for 3 to 6 months. A small number of people have moderate to severe weakness that is permanent.  °HOME CARE INSTRUCTIONS  °· If your caregiver prescribed medication to reduce swelling in the nerve, use as directed. Do not stop taking the medication unless directed by your caregiver. °· Use moisturizing eye drops as needed to prevent drying of your eye, as directed by your caregiver. °· Protect your eye, as directed by your caregiver. °· Use facial massage and exercises, as directed by your caregiver. °· Perform your normal activities, and get your normal rest. °SEEK IMMEDIATE MEDICAL CARE IF:  °· There is pain, redness or irritation in the eye. °· You or your child has an oral temperature above 102° F (38.9° C), not controlled by medicine. °MAKE SURE YOU:  °· Understand these instructions. °· Will watch your condition. °· Will get help right away if you are not doing well or get worse. °Document Released: 12/22/2004 Document Revised: 03/16/2011 Document Reviewed: 03/31/2013 °[employer]® Patient Information ©2015 [employer]. This information is not intended to replace advice given to you by your health care provider. Make sure you discuss any questions you have with your health care provider. ° °

## 2014-08-17 NOTE — Telephone Encounter (Signed)
Error/disregard

## 2017-01-19 ENCOUNTER — Ambulatory Visit (INDEPENDENT_AMBULATORY_CARE_PROVIDER_SITE_OTHER): Payer: Medicare Other | Admitting: Cardiovascular Disease

## 2017-01-19 ENCOUNTER — Encounter: Payer: Self-pay | Admitting: Cardiovascular Disease

## 2017-01-19 VITALS — BP 116/79 | HR 80 | Ht 63.0 in | Wt 169.2 lb

## 2017-01-19 DIAGNOSIS — R0602 Shortness of breath: Secondary | ICD-10-CM

## 2017-01-19 DIAGNOSIS — R0609 Other forms of dyspnea: Secondary | ICD-10-CM | POA: Insufficient documentation

## 2017-01-19 DIAGNOSIS — R0600 Dyspnea, unspecified: Secondary | ICD-10-CM

## 2017-01-19 DIAGNOSIS — E782 Mixed hyperlipidemia: Secondary | ICD-10-CM | POA: Insufficient documentation

## 2017-01-19 DIAGNOSIS — E7800 Pure hypercholesterolemia, unspecified: Secondary | ICD-10-CM | POA: Diagnosis not present

## 2017-01-19 DIAGNOSIS — E785 Hyperlipidemia, unspecified: Secondary | ICD-10-CM | POA: Insufficient documentation

## 2017-01-19 NOTE — Assessment & Plan Note (Signed)
Ms. Sheryl Parker has significant dyspnea on exertion out of proportion to the degree of tobacco abuse in the past. She does have a strong family history of heart disease. She really denies chest pain. I am going to 2-D echo and a pharmacologic stress test to rule out an ischemic etiology.

## 2017-01-19 NOTE — Progress Notes (Signed)
01/19/2017 Sheryl Parker   06-26-1950  [id number]  Primary Physician Ralene Ok, MD Primary Cardiologist: Runell Gess MD FACP, [place], [place], [place]  HPI:  Sheryl Parker is a 67 y.o. mildly overweight married Caucasian female mother of one son is accompanied by her husband Sheryl Parker who I also saw in the remote past. She self-referred for cardiovascular evaluation because of risk factors and progressive dyspnea. She does smoke 2-6 cigarettes a day but only began smoking at age 49. She has treated hypertension, diabetes and hyperlipidemia as well as a strong family history of heart disease with father that died of a myocardial infarction and several siblings that have ischemic heart disease as well. She denies chest pain but has had progressive dyspnea. She lives a sedentary lifestyle.   Current Meds  Medication Sig  . ALPRAZolam (XANAX) 1 MG tablet Take 1 tablet by mouth 3 (three) times daily as needed.  Marland Kitchen amphetamine-dextroamphetamine (ADDERALL) 20 MG tablet Take 20 mg by mouth 2 (two) times daily.  . ARIPiprazole (ABILIFY) 5 MG tablet Take 2.5-5 mg by mouth daily.  . Cholecalciferol (VITAMIN D) 2000 UNITS CAPS Take 1 capsule by mouth daily.  Marland Kitchen FLUoxetine (PROZAC) 20 MG tablet Take 40 mg by mouth daily.  . hydrOXYzine (VISTARIL) 25 MG capsule Take 1 capsule (25 mg total) by mouth 3 (three) times daily as needed for anxiety.  Marland Kitchen ibuprofen (ADVIL,MOTRIN) 200 MG tablet Take 200 mg by mouth every 6 (six) hours as needed for moderate pain.  Marland Kitchen lidocaine (LIDODERM) 5 % Place 1 patch onto the skin daily as needed. Remove & Discard patch within 12 hours or as directed by MD For fibromyalgia  . metoprolol succinate (TOPROL-XL) 25 MG 24 hr tablet Take 25 mg by mouth daily.  . Multiple Vitamin (MULITIVITAMIN WITH MINERALS) TABS Take 1 tablet by mouth daily.  . pantoprazole (PROTONIX) 40 MG tablet Take 40 mg by mouth daily.  . predniSONE (DELTASONE) 20 MG tablet Take 3 tablets (60 mg total) by  mouth daily.  . promethazine (PHENERGAN) 25 MG tablet Take 25 mg by mouth every 8 (eight) hours as needed. For nausea.  . rizatriptan (MAXALT) 10 MG tablet Take 10 mg by mouth as needed. May repeat in 2 hours if needed for migraines.  Marland Kitchen telmisartan (MICARDIS) 80 MG tablet Take 80 mg by mouth daily.  . temazepam (RESTORIL) 22.5 MG capsule Take 1 capsule by mouth at bedtime.     Allergies  Allergen Reactions  . Penicillins Anaphylaxis  . Sulfa Drugs Cross Reactors Anaphylaxis  . Codeine Nausea And Vomiting  . Levofloxacin Other (See Comments)    Severe sweating and dhydration   . Prednisone Other (See Comments)    Visual disturbances and migraines     Social History   Socioeconomic History  . Marital status: Married    Spouse name: Not on file  . Number of children: 1  . Years of education: Not on file  . Highest education level: Not on file  Social Needs  . Financial resource strain: Not on file  . Food insecurity - worry: Not on file  . Food insecurity - inability: Not on file  . Transportation needs - medical: Not on file  . Transportation needs - non-medical: Not on file  Occupational History  . Occupation: Surveyor, mining  Tobacco Use  . Smoking status: Current Some Day Smoker    Packs/day: 0.20    Years: 8.00    Pack years: 1.60  Types: Cigarettes  . Smokeless tobacco: Never Used  . Tobacco comment: 1-3 cigarettes  Substance and Sexual Activity  . Alcohol use: No    Comment: HEAVY ETOH X 25 YRS, 8 YRS AGO  . Drug use: No  . Sexual activity: Not on file  Other Topics Concern  . Not on file  Social History Narrative   1 SON-Hep C, polysubstance abuse     Review of Systems: General: negative for chills, fever, night sweats or weight changes.  Cardiovascular: negative for chest pain, dyspnea on exertion, edema, orthopnea, palpitations, paroxysmal nocturnal dyspnea or shortness of breath Dermatological: negative for rash Respiratory: negative  for cough or wheezing Urologic: negative for hematuria Abdominal: negative for nausea, vomiting, diarrhea, bright red blood per rectum, melena, or hematemesis Neurologic: negative for visual changes, syncope, or dizziness All other systems reviewed and are otherwise negative except as noted above.    Blood pressure 116/79, pulse 80, height 5\' 3"  (1.6 m), weight 169 lb 3.2 oz (76.7 kg).  General appearance: alert and no distress Neck: no adenopathy, no carotid bruit, no JVD, supple, symmetrical, trachea midline and thyroid not enlarged, symmetric, no tenderness/mass/nodules Lungs: clear to auscultation bilaterally Heart: regular rate and rhythm, S1, S2 normal, no murmur, click, rub or gallop Extremities: extremities normal, atraumatic, no cyanosis or edema Pulses: 2+ and symmetric Skin: Skin color, texture, turgor normal. No rashes or lesions Neurologic: Alert and oriented X 3, normal strength and tone. Normal symmetric reflexes. Normal coordination and gait  EKG sinus rhythm at 80 with lateral T-wave inversion. I personally reviewed this EKG.  ASSESSMENT AND PLAN:   HYPERTENSION History of essential hypertension with blood pressure measured at 116/79. She is on Micardis and metoprolol. Continue current meds at current dosing.  Hyperlipidemia History of hyperlipidemia on low-dose statin therapy begun by her PCP with recent blood work performed 12/10/16 revealing total cholesterol 272, LDL 195 and HDL of 45.  Dyspnea on exertion Sheryl Parker has significant dyspnea on exertion out of proportion to the degree of tobacco abuse in the past. She does have a strong family history of heart disease. She really denies chest pain. I am going to 2-D echo and a pharmacologic stress test to rule out an ischemic etiology.      Runell GessJonathan J. Talayah Picardi MD FACP,FACC,FAHA, [facility] 01/19/2017 3:04 PM

## 2017-01-19 NOTE — Patient Instructions (Signed)
Medication Instructions: Your physician recommends that you continue on your current medications as directed. Please refer to the Current Medication list given to you today.   Testing/Procedures: Your physician has requested that you have an echocardiogram. Echocardiography is a painless test that uses sound waves to create images of your heart. It provides your doctor with information about the size and shape of your heart and how well your heart's chambers and valves are working. This procedure takes approximately one hour. There are no restrictions for this procedure.  Your physician has requested that you have a lexiscan myoview. For further information please visit [email or web address]. Please follow instruction sheet, as given.  Follow-Up: Your physician wants you to follow-up in: 6 months with Dr. Berry. You will receive a reminder letter in the mail two months in advance. If you don't receive a letter, please call our office to schedule the follow-up appointment.  If you need a refill on your cardiac medications before your next appointment, please call your pharmacy.  

## 2017-01-19 NOTE — Assessment & Plan Note (Signed)
History of hyperlipidemia on low-dose statin therapy begun by her PCP with recent blood work performed 12/10/16 revealing total cholesterol 272, LDL 195 and HDL of 45.

## 2017-01-19 NOTE — Assessment & Plan Note (Signed)
History of essential hypertension with blood pressure measured at 116/79. She is on Micardis and metoprolol. Continue current meds at current dosing.

## 2017-01-20 ENCOUNTER — Telehealth (HOSPITAL_COMMUNITY): Payer: Self-pay

## 2017-01-20 NOTE — Telephone Encounter (Signed)
Encounter complete. 

## 2017-01-21 ENCOUNTER — Telehealth (HOSPITAL_COMMUNITY): Payer: Self-pay

## 2017-01-21 NOTE — Telephone Encounter (Signed)
I am unable to reach this patient after multiple attempts.  Encounter complete.

## 2017-01-22 ENCOUNTER — Ambulatory Visit (HOSPITAL_COMMUNITY)
Admission: RE | Admit: 2017-01-22 | Discharge: 2017-01-22 | Disposition: A | Discharge status: Home / Self Care | Payer: Medicare Other | Source: Ambulatory Visit | Attending: Cardiology | Admitting: Cardiology

## 2017-01-22 DIAGNOSIS — I10 Essential (primary) hypertension: Secondary | ICD-10-CM | POA: Diagnosis not present

## 2017-01-22 DIAGNOSIS — R5383 Other fatigue: Secondary | ICD-10-CM | POA: Diagnosis not present

## 2017-01-22 DIAGNOSIS — Z6829 Body mass index (BMI) 29.0-29.9, adult: Secondary | ICD-10-CM | POA: Diagnosis not present

## 2017-01-22 DIAGNOSIS — F17200 Nicotine dependence, unspecified, uncomplicated: Secondary | ICD-10-CM | POA: Insufficient documentation

## 2017-01-22 DIAGNOSIS — R9439 Abnormal result of other cardiovascular function study: Secondary | ICD-10-CM | POA: Diagnosis not present

## 2017-01-22 DIAGNOSIS — E119 Type 2 diabetes mellitus without complications: Secondary | ICD-10-CM | POA: Insufficient documentation

## 2017-01-22 DIAGNOSIS — R0609 Other forms of dyspnea: Secondary | ICD-10-CM | POA: Diagnosis not present

## 2017-01-22 DIAGNOSIS — R0602 Shortness of breath: Secondary | ICD-10-CM | POA: Insufficient documentation

## 2017-01-22 DIAGNOSIS — Z8249 Family history of ischemic heart disease and other diseases of the circulatory system: Secondary | ICD-10-CM | POA: Insufficient documentation

## 2017-01-22 DIAGNOSIS — E663 Overweight: Secondary | ICD-10-CM | POA: Diagnosis not present

## 2017-01-22 DIAGNOSIS — R42 Dizziness and giddiness: Secondary | ICD-10-CM | POA: Diagnosis not present

## 2017-01-22 LAB — MYOCARDIAL PERFUSION IMAGING
CHL CUP NUCLEAR SRS: 4
CHL CUP NUCLEAR SSS: 10
CSEPPHR: 93 {beats}/min
LV dias vol: 50 mL (ref 46–106)
LV sys vol: 12 mL
NUC STRESS TID: 1.28
Rest HR: 57 {beats}/min
SDS: 6

## 2017-01-22 MED ORDER — TECHNETIUM TC 99M TETROFOSMIN IV KIT
9.9000 | PACK | Freq: Once | INTRAVENOUS | Status: AC | PRN
Start: 2017-01-22 — End: 2017-01-22
  Administered 2017-01-22: 9.9 via INTRAVENOUS
  Filled 2017-01-22: qty 10

## 2017-01-22 MED ORDER — REGADENOSON 0.4 MG/5ML IV SOLN
0.4000 mg | Freq: Once | INTRAVENOUS | Status: AC
  Administered 2017-01-22: 0.4 mg via INTRAVENOUS

## 2017-01-22 MED ORDER — TECHNETIUM TC 99M TETROFOSMIN IV KIT
32.1000 | PACK | Freq: Once | INTRAVENOUS | Status: AC | PRN
  Administered 2017-01-22: 32.1 via INTRAVENOUS
  Filled 2017-01-22: qty 33

## 2017-01-28 ENCOUNTER — Ambulatory Visit (HOSPITAL_COMMUNITY): Payer: Medicare Other | Attending: Cardiology

## 2017-01-28 ENCOUNTER — Other Ambulatory Visit: Payer: Self-pay

## 2017-01-28 DIAGNOSIS — R0602 Shortness of breath: Secondary | ICD-10-CM | POA: Insufficient documentation

## 2017-01-28 DIAGNOSIS — I080 Rheumatic disorders of both mitral and aortic valves: Secondary | ICD-10-CM | POA: Diagnosis not present

## 2017-01-28 DIAGNOSIS — Z8249 Family history of ischemic heart disease and other diseases of the circulatory system: Secondary | ICD-10-CM | POA: Insufficient documentation

## 2017-01-28 DIAGNOSIS — Z720 Tobacco use: Secondary | ICD-10-CM | POA: Insufficient documentation

## 2017-01-28 DIAGNOSIS — I10 Essential (primary) hypertension: Secondary | ICD-10-CM | POA: Insufficient documentation

## 2017-01-28 DIAGNOSIS — E785 Hyperlipidemia, unspecified: Secondary | ICD-10-CM | POA: Insufficient documentation

## 2017-01-28 DIAGNOSIS — E119 Type 2 diabetes mellitus without complications: Secondary | ICD-10-CM | POA: Insufficient documentation

## 2017-07-20 ENCOUNTER — Ambulatory Visit: Payer: Self-pay | Admitting: Cardiovascular Disease

## 2017-07-22 ENCOUNTER — Ambulatory Visit: Payer: Self-pay | Admitting: Cardiology

## 2017-08-31 ENCOUNTER — Encounter

## 2017-08-31 ENCOUNTER — Encounter: Payer: Self-pay | Admitting: Cardiovascular Disease

## 2017-08-31 ENCOUNTER — Ambulatory Visit (INDEPENDENT_AMBULATORY_CARE_PROVIDER_SITE_OTHER): Payer: Medicare Other | Admitting: Cardiovascular Disease

## 2017-08-31 VITALS — BP 112/76 | HR 71 | Ht 63.0 in | Wt 166.8 lb

## 2017-08-31 DIAGNOSIS — R0600 Dyspnea, unspecified: Secondary | ICD-10-CM

## 2017-08-31 DIAGNOSIS — R0609 Other forms of dyspnea: Secondary | ICD-10-CM

## 2017-08-31 DIAGNOSIS — E7800 Pure hypercholesterolemia, unspecified: Secondary | ICD-10-CM

## 2017-08-31 NOTE — Assessment & Plan Note (Signed)
History of dyspnea on exertion with echo performed 01/28/2017 that was essentially normal and a Myoview stress test that was low risk.  Since I saw her last her dyspnea has markedly improved.

## 2017-08-31 NOTE — Patient Instructions (Signed)
Your physician wants you to follow-up in: ONE YEAR WITH DR BERRY You will receive a reminder letter in the mail two months in advance. If you don't receive a letter, please call our office to schedule the follow-up appointment.   If you need a refill on your cardiac medications before your next appointment, please call your pharmacy.  

## 2017-08-31 NOTE — Assessment & Plan Note (Signed)
History of hyperlipidemia on statin therapy not at goal.  The only statin she can take his Crestor.  We will explore Repatha therapy.

## 2017-08-31 NOTE — Assessment & Plan Note (Signed)
History of essential hypertension blood pressure measured at 112/76.  She is on metoprolol and Micardis.  Continue current meds at current dosing.

## 2017-08-31 NOTE — Progress Notes (Signed)
08/31/2017 Sheryl Parker   [birth year]  [id number]  Primary Physician Ralene Ok, MD Primary Cardiologist: Runell Gess MD Milagros Loll, [place], [place]  HPI:  Sheryl Parker is a 67 y.o.   mildly overweight married Caucasian female mother of one son i who I last saw in the office 01/19/2017.  She self-referred for cardiovascular evaluation because of risk factors and progressive dyspnea. She does smoke 2-6 cigarettes a day but only began smoking at age 36. She has treated hypertension, diabetes and hyperlipidemia as well as a strong family history of heart disease with father that died of a myocardial infarction and several siblings that have ischemic heart disease as well.  When I saw her last she was complaining some dyspnea.  I did get a 2D echo that was essentially normal and a Myoview that was low risk.  Her symptoms have since significantly improved/resolved.  Current Meds  Medication Sig  . ALPRAZolam (XANAX) 1 MG tablet Take 1 tablet by mouth 3 (three) times daily as needed.  Marland Kitchen amphetamine-dextroamphetamine (ADDERALL) 20 MG tablet Take 20 mg by mouth 2 (two) times daily.  . ARIPiprazole (ABILIFY) 5 MG tablet Take 2.5-5 mg by mouth daily.  . Cholecalciferol (VITAMIN D) 2000 UNITS CAPS Take 1 capsule by mouth daily.  Marland Kitchen FLUoxetine (PROZAC) 20 MG tablet Take 40 mg by mouth daily.  Marland Kitchen ibuprofen (ADVIL,MOTRIN) 200 MG tablet Take 200 mg by mouth every 6 (six) hours as needed for moderate pain.  Marland Kitchen insulin degludec (TRESIBA FLEXTOUCH) 100 UNIT/ML SOPN FlexTouch Pen Inject 26 Units into the skin daily.  Marland Kitchen lidocaine (LIDODERM) 5 % Place 1 patch onto the skin daily as needed. Remove & Discard patch within 12 hours or as directed by MD For fibromyalgia  . metoprolol succinate (TOPROL-XL) 25 MG 24 hr tablet Take 25 mg by mouth daily.  . Multiple Vitamin (MULITIVITAMIN WITH MINERALS) TABS Take 1 tablet by mouth daily.  . promethazine (PHENERGAN) 25 MG tablet Take 25 mg by mouth every 8 (eight)  hours as needed. For nausea.  . rizatriptan (MAXALT) 10 MG tablet Take 10 mg by mouth as needed. May repeat in 2 hours if needed for migraines.  Marland Kitchen telmisartan (MICARDIS) 80 MG tablet Take 80 mg by mouth daily.  . temazepam (RESTORIL) 22.5 MG capsule Take 1 capsule by mouth at bedtime.     Allergies  Allergen Reactions  . Penicillins Anaphylaxis  . Sulfa Drugs Cross Reactors Anaphylaxis  . Codeine Nausea And Vomiting  . Levofloxacin Other (See Comments)    Severe sweating and dhydration   . Prednisone Other (See Comments)    Visual disturbances and migraines     Social History   Socioeconomic History  . Marital status: Married    Spouse name: Not on file  . Number of children: 1  . Years of education: Not on file  . Highest education level: Not on file  Occupational History  . Occupation: Surveyor, mining  Social Needs  . Financial resource strain: Not on file  . Food insecurity:    Worry: Not on file    Inability: Not on file  . Transportation needs:    Medical: Not on file    Non-medical: Not on file  Tobacco Use  . Smoking status: Current Some Day Smoker    Packs/day: 0.20    Years: 8.00    Pack years: 1.60    Types: Cigarettes  . Smokeless tobacco: Never Used  . Tobacco comment: 1-3  cigarettes  Substance and Sexual Activity  . Alcohol use: No    Comment: HEAVY ETOH X 25 YRS, 8 YRS AGO  . Drug use: No  . Sexual activity: Not on file  Lifestyle  . Physical activity:    Days per week: Not on file    Minutes per session: Not on file  . Stress: Not on file  Relationships  . Social connections:    Talks on phone: Not on file    Gets together: Not on file    Attends religious service: Not on file    Active member of club or organization: Not on file    Attends meetings of clubs or organizations: Not on file    Relationship status: Not on file  . Intimate partner violence:    Fear of current or ex partner: Not on file    Emotionally abused: Not  on file    Physically abused: Not on file    Forced sexual activity: Not on file  Other Topics Concern  . Not on file  Social History Narrative   1 SON-Hep C, polysubstance abuse     Review of Systems: General: negative for chills, fever, night sweats or weight changes.  Cardiovascular: negative for chest pain, dyspnea on exertion, edema, orthopnea, palpitations, paroxysmal nocturnal dyspnea or shortness of breath Dermatological: negative for rash Respiratory: negative for cough or wheezing Urologic: negative for hematuria Abdominal: negative for nausea, vomiting, diarrhea, bright red blood per rectum, melena, or hematemesis Neurologic: negative for visual changes, syncope, or dizziness All other systems reviewed and are otherwise negative except as noted above.    Blood pressure 112/76, pulse 71, height 5\' 3"  (1.6 m), weight 166 lb 12.8 oz (75.7 kg).  General appearance: alert and no distress Neck: no adenopathy, no carotid bruit, no JVD, supple, symmetrical, trachea midline and thyroid not enlarged, symmetric, no tenderness/mass/nodules Lungs: clear to auscultation bilaterally Heart: regular rate and rhythm, S1, S2 normal, no murmur, click, rub or gallop Extremities: extremities normal, atraumatic, no cyanosis or edema Pulses: 2+ and symmetric Skin: Skin color, texture, turgor normal. No rashes or lesions Neurologic: Alert and oriented X 3, normal strength and tone. Normal symmetric reflexes. Normal coordination and gait  EKG sinus rhythm at 71 with nonspecific ST and T wave changes.  I personally reviewed this EKG.  ASSESSMENT AND PLAN:   HYPERTENSION History of essential hypertension blood pressure measured at 112/76.  She is on metoprolol and Micardis.  Continue current meds at current dosing.  Hyperlipidemia History of hyperlipidemia on statin therapy not at goal.  The only statin she can take his Crestor.  We will explore Repatha therapy.  Dyspnea on exertion History  of dyspnea on exertion with echo performed 01/28/2017 that was essentially normal and a Myoview stress test that was low risk.  Since I saw her last her dyspnea has markedly improved.      Runell GessJonathan J. Wanya Bangura MD FACP,FACC,FAHA, [facility] 08/31/2017 2:34 PM

## 2017-09-14 ENCOUNTER — Telehealth: Payer: Self-pay | Admitting: Pharmacist

## 2017-09-14 NOTE — Telephone Encounter (Signed)
Still waiting for recent (baseline) lipid panel to complete PA for Repatha.   Patient to bring copy of call back to schedule blood work

## 2017-11-11 ENCOUNTER — Other Ambulatory Visit (HOSPITAL_COMMUNITY): Payer: Self-pay | Admitting: Internal Medicine

## 2017-11-11 DIAGNOSIS — E119 Type 2 diabetes mellitus without complications: Secondary | ICD-10-CM

## 2017-11-16 ENCOUNTER — Other Ambulatory Visit (HOSPITAL_COMMUNITY): Payer: Self-pay | Admitting: Internal Medicine

## 2017-11-16 ENCOUNTER — Ambulatory Visit (HOSPITAL_COMMUNITY)
Admission: RE | Admit: 2017-11-16 | Discharge: 2017-11-16 | Disposition: A | Discharge status: Home / Self Care | Payer: Medicare Other | Source: Ambulatory Visit | Attending: Internal Medicine | Admitting: Internal Medicine

## 2017-11-16 DIAGNOSIS — E119 Type 2 diabetes mellitus without complications: Secondary | ICD-10-CM | POA: Diagnosis not present

## 2018-06-08 ENCOUNTER — Other Ambulatory Visit: Payer: Self-pay | Admitting: Pharmacist

## 2018-06-08 MED ORDER — EVOLOCUMAB 140 MG/ML ~~LOC~~ SOAJ: 140.0000 mg | SUBCUTANEOUS | 11 refills | Status: DC

## 2018-06-08 NOTE — Telephone Encounter (Signed)
LMOM; patient to call back and update cloinic on Repatha ussage.  Able to start? Any ADR or problems?

## 2018-10-19 ENCOUNTER — Telehealth: Payer: Self-pay | Admitting: *Deleted

## 2018-10-19 NOTE — Telephone Encounter (Signed)
A message was left, re: her follow up visit. 

## 2019-02-28 ENCOUNTER — Ambulatory Visit (INDEPENDENT_AMBULATORY_CARE_PROVIDER_SITE_OTHER): Payer: Medicare Other | Admitting: Orthopaedic Surgery

## 2019-02-28 ENCOUNTER — Ambulatory Visit: Payer: Medicare Other

## 2019-02-28 ENCOUNTER — Encounter: Payer: Self-pay | Admitting: Orthopaedic Surgery

## 2019-02-28 ENCOUNTER — Other Ambulatory Visit: Payer: Self-pay

## 2019-02-28 VITALS — BP 133/81 | HR 85 | Ht 63.0 in | Wt 168.0 lb

## 2019-02-28 DIAGNOSIS — M5136 Other intervertebral disc degeneration, lumbar region: Secondary | ICD-10-CM | POA: Insufficient documentation

## 2019-02-28 DIAGNOSIS — G8929 Other chronic pain: Secondary | ICD-10-CM | POA: Diagnosis not present

## 2019-02-28 DIAGNOSIS — M79672 Pain in left foot: Secondary | ICD-10-CM

## 2019-02-28 DIAGNOSIS — M25562 Pain in left knee: Secondary | ICD-10-CM

## 2019-02-28 DIAGNOSIS — M47816 Spondylosis without myelopathy or radiculopathy, lumbar region: Secondary | ICD-10-CM | POA: Insufficient documentation

## 2019-02-28 DIAGNOSIS — M797 Fibromyalgia: Secondary | ICD-10-CM | POA: Insufficient documentation

## 2019-02-28 LAB — URIC ACID: Uric Acid, Serum: 4.8 mg/dL (ref 2.5–7.0)

## 2019-02-28 MED ORDER — NAPROXEN 500 MG PO TABS: 500.0000 mg | ORAL_TABLET | Freq: Two times a day (BID) | ORAL | 5 refills | Status: DC

## 2019-02-28 NOTE — Progress Notes (Signed)
Subjective:    Patient ID: Sheryl Parker, female    DOB: [birth year], 69 y.o.   MRN: [id number]  HPI She has had pain in both knees, more on the left for over six months getting slowly worse.  She has no new trauma. She has no redness, no giving way. She has pain getting up from a seated position.  She has tried ice, heat and Tylenol with no help.    She has pain of the left great toe and the second metatarsal over the last two months getting worse.  She has no trauma.  She denies history of gout.  She has not changed shoe wear.   Review of Systems  Constitutional: Positive for activity change.  Musculoskeletal: Positive for arthralgias, gait problem and joint swelling.  Neurological: Positive for headaches.  All other systems reviewed and are negative.  For Review of Systems, all other systems reviewed and are negative.  The following is a summary of the past history medically, past history surgically, known current medicines, social history and family history.  This information is gathered electronically by the computer from prior information and documentation.  I review this each visit and have found including this information at this point in the chart is beneficial and informative.   Past Medical History:  Diagnosis Date  . Arthritis   . Attention deficit    Dr. Misty Stanley [place]  . Chronic back pain    hx chronic narcotics- Dr in [place] recent  meds since 2 yrs ago  . Complication of anesthesia   . Disc degeneration   . Diverticulitis   . Fibromyalgia   . Hypertension   . IBS (irritable bowel syndrome)   . Migraine   . Osteoporosis   . PONV (postoperative nausea and vomiting)   . Raynaud disease     Past Surgical History:  Procedure Laterality Date  . I & D EXTREMITY Left 03/23/2014   Procedure: IRRIGATION AND DEBRIDEMENT LEFT FINGER;  Surgeon: Bradly Bienenstock, MD;  Location: [facility];  Service: Orthopedics;  Laterality: Left;  . OOPHORECTOMY  2002   right  . TUBAL LIGATION      Current Outpatient Medications on File Prior to Visit  Medication Sig Dispense Refill  . ALPRAZolam (XANAX) 1 MG tablet Take 1 tablet by mouth 3 (three) times daily as needed.  1  . amphetamine-dextroamphetamine (ADDERALL) 20 MG tablet Take 20 mg by mouth 2 (two) times daily.    . ARIPiprazole (ABILIFY) 5 MG tablet Take 2.5-5 mg by mouth daily.    . Cholecalciferol (VITAMIN D) 2000 UNITS CAPS Take 1 capsule by mouth daily.    Marland Kitchen FLUoxetine (PROZAC) 20 MG tablet Take 40 mg by mouth daily.    Marland Kitchen ibuprofen (ADVIL,MOTRIN) 200 MG tablet Take 200 mg by mouth every 6 (six) hours as needed for moderate pain.    Marland Kitchen insulin degludec (TRESIBA FLEXTOUCH) 100 UNIT/ML SOPN FlexTouch Pen Inject 26 Units into the skin daily.    Marland Kitchen lidocaine (LIDODERM) 5 % Place 1 patch onto the skin daily as needed. Remove & Discard patch within 12 hours or as directed by MD For fibromyalgia    . metoprolol succinate (TOPROL-XL) 25 MG 24 hr tablet Take 25 mg by mouth daily.    . Multiple Vitamin (MULITIVITAMIN WITH MINERALS) TABS Take 1 tablet by mouth daily.    . rizatriptan (MAXALT) 10 MG tablet Take 10 mg by mouth as needed. May repeat in 2 hours if needed for migraines.    Marland Kitchen  telmisartan (MICARDIS) 80 MG tablet Take 80 mg by mouth daily.    . temazepam (RESTORIL) 22.5 MG capsule Take 1 capsule by mouth at bedtime.  0  . Evolocumab (REPATHA SURECLICK) 140 MG/ML SOAJ Inject 140 mg into the skin every 14 (fourteen) days. (Patient not taking: Reported on 02/28/2019) 2 pen 11  . promethazine (PHENERGAN) 25 MG tablet Take 25 mg by mouth every 8 (eight) hours as needed. For nausea.     No current facility-administered medications on file prior to visit.    Social History   Socioeconomic History  . Marital status: Married    Spouse name: Not on file  . Number of children: 1  . Years of education: Not on file  . Highest education level: Not on file  Occupational History  . Occupation:  Surveyor, mining  Tobacco Use  . Smoking status: Former Smoker    Packs/day: 0.20    Years: 8.00    Pack years: 1.60    Types: Cigarettes    Quit date: 01/05/2018    Years since quitting: 1.1  . Smokeless tobacco: Never Used  . Tobacco comment: 1-3 cigarettes  Substance and Sexual Activity  . Alcohol use: No    Comment: HEAVY ETOH X 25 YRS, 8 YRS AGO  . Drug use: No  . Sexual activity: Not on file  Other Topics Concern  . Not on file  Social History Narrative   1 SON-Hep C, polysubstance abuse   Social Determinants of Health   Financial Resource Strain:   . Difficulty of Paying Living Expenses: Not on file  Food Insecurity:   . Worried About Programme researcher, broadcasting/film/video in the Last Year: Not on file  . Ran Out of Food in the Last Year: Not on file  Transportation Needs:   . Lack of Transportation (Medical): Not on file  . Lack of Transportation (Non-Medical): Not on file  Physical Activity:   . Days of Exercise per Week: Not on file  . Minutes of Exercise per Session: Not on file  Stress:   . Feeling of Stress : Not on file  Social Connections:   . Frequency of Communication with Friends and Family: Not on file  . Frequency of Social Gatherings with Friends and Family: Not on file  . Attends Religious Services: Not on file  . Active Member of Clubs or Organizations: Not on file  . Attends Banker Meetings: Not on file  . Marital Status: Not on file  Intimate Partner Violence:   . Fear of Current or Ex-Partner: Not on file  . Emotionally Abused: Not on file  . Physically Abused: Not on file  . Sexually Abused: Not on file    Family History  Problem Relation Age of Onset  . Colon polyps Brother   . Cirrhosis Brother        etoh  . Breast cancer Sister   . Liver disease Son        Hepatitis C    BP 133/81   Pulse 85   Ht 5\' 3"  (1.6 m)   Wt 168 lb (76.2 kg)   BMI 29.76 kg/m   Body mass index is 29.76 kg/m.     Objective:   Physical  Exam Vitals and nursing note reviewed.  Constitutional:      Appearance: She is well-developed.  HENT:     Head: Normocephalic and atraumatic.  Eyes:     Conjunctiva/sclera: Conjunctivae normal.     Pupils: Pupils  are equal, round, and reactive to light.  Cardiovascular:     Rate and Rhythm: Normal rate and regular rhythm.  Pulmonary:     Effort: Pulmonary effort is normal.  Abdominal:     Palpations: Abdomen is soft.  Musculoskeletal:     Cervical back: Normal range of motion and neck supple.       Legs:       Feet:  Skin:    General: Skin is warm and dry.  Neurological:     Mental Status: She is alert and oriented to person, place, and time.     Cranial Nerves: No cranial nerve deficit.     Motor: No abnormal muscle tone.     Coordination: Coordination normal.     Deep Tendon Reflexes: Reflexes are normal and symmetric. Reflexes normal.  Psychiatric:        Behavior: Behavior normal.        Thought Content: Thought content normal.        Judgment: Judgment normal.     X-rays were done of the left knee and left foot, reported separately.      Assessment & Plan:   Encounter Diagnoses  Name Primary?  . Chronic pain of left knee Yes  . Pain in left foot    I am concerned about possible gout or occult fracture of the second metatarsal.  I will get serum uric acid and also get bone scan of the left foot.  PROCEDURE NOTE:  The patient requests injections of the left knee , verbal consent was obtained.  The left knee was prepped appropriately after time out was performed.   Sterile technique was observed and injection of 1 cc of Depo-Medrol 40 mg with several cc's of plain xylocaine. Anesthesia was provided by ethyl chloride and a 20-gauge needle was used to inject the knee area. The injection was tolerated well.  A band aid dressing was applied.  The patient was advised to apply ice later today and tomorrow to the injection sight as needed.  I will begin Naprosyn  500 po bid pc.  Return in two weeks.  Call if any problem.  Precautions discussed.   Electronically Signed Sanjuana Kava, MD 2/23/20219:57 AM

## 2019-03-06 ENCOUNTER — Other Ambulatory Visit: Payer: Self-pay | Admitting: Nephrology

## 2019-03-06 DIAGNOSIS — N1831 Chronic kidney disease, stage 3a: Secondary | ICD-10-CM

## 2019-04-26 ENCOUNTER — Other Ambulatory Visit: Payer: Self-pay | Admitting: Nephrology

## 2019-04-26 DIAGNOSIS — N1831 Chronic kidney disease, stage 3a: Secondary | ICD-10-CM

## 2019-05-16 LAB — COMPREHENSIVE METABOLIC PANEL
GFR calc Af Amer: 62
GFR calc non Af Amer: 54

## 2019-05-16 LAB — LIPID PANEL
Cholesterol: 144 (ref 0–200)
HDL: 52 (ref 35–70)
LDL Cholesterol: 72
Triglycerides: 101 (ref 40–160)

## 2019-05-16 LAB — BASIC METABOLIC PANEL
BUN: 21 (ref 4–21)
Creatinine: 1.1 (ref 0.5–1.1)

## 2019-05-16 LAB — HEMOGLOBIN A1C: Hemoglobin A1C: 8

## 2019-06-09 ENCOUNTER — Encounter: Payer: Self-pay | Admitting: General Practice

## 2019-08-07 ENCOUNTER — Encounter: Payer: Self-pay | Admitting: "Endocrinology

## 2019-08-07 ENCOUNTER — Other Ambulatory Visit: Payer: Self-pay

## 2019-08-07 ENCOUNTER — Ambulatory Visit (INDEPENDENT_AMBULATORY_CARE_PROVIDER_SITE_OTHER): Payer: Medicare Other | Admitting: "Endocrinology

## 2019-08-07 VITALS — BP 128/80 | HR 80 | Ht 63.0 in | Wt 166.0 lb

## 2019-08-07 DIAGNOSIS — E1165 Type 2 diabetes mellitus with hyperglycemia: Secondary | ICD-10-CM

## 2019-08-07 DIAGNOSIS — I10 Essential (primary) hypertension: Secondary | ICD-10-CM

## 2019-08-07 DIAGNOSIS — E782 Mixed hyperlipidemia: Secondary | ICD-10-CM

## 2019-08-07 NOTE — Patient Instructions (Signed)

## 2019-08-07 NOTE — Progress Notes (Signed)
Endocrinology Consult Note       08/07/2019, 2:53 PM   Subjective:    Patient ID: Sheryl Parker, female    DOB: [birth year].  Sheryl Parker is being seen in consultation for management of currently uncontrolled symptomatic diabetes requested by  Ralene Ok, MD.   Past Medical History:  Diagnosis Date  . Arthritis   . Attention deficit    Dr. Misty Stanley [place]  . Chronic back pain    hx chronic narcotics- Dr in [place] recent  meds since 2 yrs ago  . Complication of anesthesia   . Disc degeneration   . Diverticulitis   . Fibromyalgia   . Hypertension   . IBS (irritable bowel syndrome)   . Migraine   . Osteoporosis   . PONV (postoperative nausea and vomiting)   . Raynaud disease     Past Surgical History:  Procedure Laterality Date  . I & D EXTREMITY Left 03/23/2014   Procedure: IRRIGATION AND DEBRIDEMENT LEFT FINGER;  Surgeon: Bradly Bienenstock, MD;  Location: [facility];  Service: Orthopedics;  Laterality: Left;  . OOPHORECTOMY  2002   right  . TUBAL LIGATION      Social History   Socioeconomic History  . Marital status: Married    Spouse name: Not on file  . Number of children: 1  . Years of education: Not on file  . Highest education level: Not on file  Occupational History  . Occupation: Surveyor, mining  Tobacco Use  . Smoking status: Former Smoker    Packs/day: 0.20    Years: 8.00    Pack years: 1.60    Types: Cigarettes    Quit date: 01/05/2018    Years since quitting: 1.5  . Smokeless tobacco: Never Used  . Tobacco comment: 1-3 cigarettes  Substance and Sexual Activity  . Alcohol use: No    Comment: HEAVY ETOH X 25 YRS, 8 YRS AGO  . Drug use: No  . Sexual activity: Not on file  Other Topics Concern  . Not on file  Social History Narrative   1 SON-Hep C, polysubstance abuse   Social Determinants of Health   Financial Resource Strain:   .  Difficulty of Paying Living Expenses:   Food Insecurity:   . Worried About Programme researcher, broadcasting/film/video in the Last Year:   . Barista in the Last Year:   Transportation Needs:   . Freight forwarder (Medical):   Marland Kitchen Lack of Transportation (Non-Medical):   Physical Activity:   . Days of Exercise per Week:   . Minutes of Exercise per Session:   Stress:   . Feeling of Stress :   Social Connections:   . Frequency of Communication with Friends and Family:   . Frequency of Social Gatherings with Friends and Family:   . Attends Religious Services:   . Active Member of Clubs or Organizations:   . Attends Banker Meetings:   Marland Kitchen Marital Status:     Family History  Problem Relation Age of Onset  . Hypertension Mother   . Hyperlipidemia Mother   . Hypertension Father   .  Hyperlipidemia Father   . Heart attack Father   . Colon polyps Brother   . Cirrhosis Brother        etoh  . Breast cancer Sister   . Liver disease Son        Hepatitis C    Outpatient Encounter Medications as of 08/07/2019  Medication Sig  . APPLE CIDER VINEGAR PO Take 1 tablet by mouth daily.  Marland Kitchen buPROPion (WELLBUTRIN XL) 300 MG 24 hr tablet Take 300 mg by mouth daily.  . [facility] CINNAMON PO Take 2,000 mg by mouth daily.  Marland Kitchen diltiazem (CARDIZEM CD) 120 MG 24 hr capsule Take 120 mg by mouth daily.  . insulin glargine (LANTUS) 100 UNIT/ML injection Inject 35 Units into the skin at bedtime.  . pioglitazone (ACTOS) 15 MG tablet Take 15 mg by mouth daily.  . rosuvastatin (CRESTOR) 10 MG tablet Take 10 mg by mouth daily.  . vitamin C (ASCORBIC ACID) 250 MG tablet Take 250 mg by mouth daily.  Marland Kitchen ALPRAZolam (XANAX) 1 MG tablet Take 1 tablet by mouth 3 (three) times daily as needed.  Marland Kitchen amphetamine-dextroamphetamine (ADDERALL) 20 MG tablet Take 20 mg by mouth 2 (two) times daily.  . ARIPiprazole (ABILIFY) 5 MG tablet Take 2.5-5 mg by mouth daily.  . Cholecalciferol (VITAMIN D) 2000 UNITS CAPS Take 1 capsule by mouth  daily.  Marland Kitchen ibuprofen (ADVIL,MOTRIN) 200 MG tablet Take 200 mg by mouth every 6 (six) hours as needed for moderate pain.  . metoprolol succinate (TOPROL-XL) 25 MG 24 hr tablet Take 25 mg by mouth daily.  . Multiple Vitamin (MULITIVITAMIN WITH MINERALS) TABS Take 1 tablet by mouth daily.  . rizatriptan (MAXALT) 10 MG tablet Take 10 mg by mouth as needed. May repeat in 2 hours if needed for migraines.  Marland Kitchen telmisartan (MICARDIS) 80 MG tablet Take 80 mg by mouth daily.  . temazepam (RESTORIL) 22.5 MG capsule Take 1 capsule by mouth at bedtime.  . [DISCONTINUED] Evolocumab (REPATHA SURECLICK) 140 MG/ML SOAJ Inject 140 mg into the skin every 14 (fourteen) days. (Patient not taking: Reported on 02/28/2019)  . [DISCONTINUED] FLUoxetine (PROZAC) 20 MG tablet Take 40 mg by mouth daily.  . [DISCONTINUED] insulin degludec (TRESIBA FLEXTOUCH) 100 UNIT/ML SOPN FlexTouch Pen Inject 26 Units into the skin daily.  . [DISCONTINUED] lidocaine (LIDODERM) 5 % Place 1 patch onto the skin daily as needed. Remove & Discard patch within 12 hours or as directed by MD For fibromyalgia  . [DISCONTINUED] naproxen (NAPROSYN) 500 MG tablet Take 1 tablet (500 mg total) by mouth 2 (two) times daily with a meal.  . [DISCONTINUED] promethazine (PHENERGAN) 25 MG tablet Take 25 mg by mouth every 8 (eight) hours as needed. For nausea.   No facility-administered encounter medications on file as of 08/07/2019.    ALLERGIES: Allergies  Allergen Reactions  . Penicillins Anaphylaxis  . Sulfa Drugs Cross Reactors Anaphylaxis  . Codeine Nausea And Vomiting  . Levofloxacin Other (See Comments)    Severe sweating and dhydration   . Prednisone Other (See Comments)    Visual disturbances and migraines     VACCINATION STATUS: Immunization History  Administered Date(s) Administered  . Influenza,inj,Quad PF,6+ Mos 10/11/2017, 10/12/2018    Diabetes She presents for her initial diabetic visit. She has type 2 diabetes mellitus. Onset  time: She was diagnosed at approximate age of 69 years. Her disease course has been worsening. There are no hypoglycemic associated symptoms. Pertinent negatives for hypoglycemia include no confusion, headaches, pallor or seizures.  Associated symptoms include fatigue, polydipsia and polyuria. Pertinent negatives for diabetes include no chest pain and no polyphagia. There are no hypoglycemic complications. There are no diabetic complications. Risk factors for coronary artery disease include diabetes mellitus, dyslipidemia, hypertension and post-menopausal. Current diabetic treatments: She is currently on Lantus 28 units nightly,, and pioglitazone 50 mg daily. Her weight is fluctuating minimally. She is following a generally unhealthy diet. When asked about meal planning, she reported none. She participates in exercise intermittently. (She did not bring any logs nor meter to review.  Her recent A1c was 8% in May 2021.) An ACE inhibitor/angiotensin II receptor blocker is being taken.  Hyperlipidemia This is a chronic problem. The current episode started more than 1 year ago. The problem is controlled. Pertinent negatives include no chest pain, myalgias or shortness of breath. Current antihyperlipidemic treatment includes statins. Risk factors for coronary artery disease include dyslipidemia, diabetes mellitus, family history, hypertension and post-menopausal.  Hypertension This is a chronic problem. The current episode started more than 1 year ago. Pertinent negatives include no chest pain, headaches, palpitations or shortness of breath. Risk factors for coronary artery disease include diabetes mellitus, dyslipidemia and post-menopausal state. Past treatments include angiotensin blockers.     Review of Systems  Constitutional: Positive for fatigue. Negative for chills, fever and unexpected weight change.  HENT: Negative for trouble swallowing and voice change.   Eyes: Negative for visual disturbance.   Respiratory: Negative for cough, shortness of breath and wheezing.   Cardiovascular: Negative for chest pain, palpitations and leg swelling.  Gastrointestinal: Negative for diarrhea, nausea and vomiting.  Endocrine: Positive for polydipsia and polyuria. Negative for cold intolerance, heat intolerance and polyphagia.  Musculoskeletal: Negative for arthralgias and myalgias.  Skin: Negative for color change, pallor, rash and wound.  Neurological: Negative for seizures and headaches.  Psychiatric/Behavioral: Negative for confusion and suicidal ideas.    Objective:    Vitals with BMI 08/07/2019 02/28/2019 08/31/2017  Height 5\' 3"  5\' 3"  5\' 3"   Weight 166 lbs 168 lbs 166 lbs 13 oz  BMI 29.41 29.77 29.55  Systolic 128 133  Diastolic 80 81 76  Pulse 80 85 71  Some encounter information is confidential and restricted. Go to Review Flowsheets activity to see all data.    BP 128/80   Pulse 80   Ht 5\' 3"  (1.6 m)   Wt 166 lb (75.3 kg)   BMI 29.41 kg/m   Wt Readings from Last 3 Encounters:  08/07/19 166 lb (75.3 kg)  02/28/19 168 lb (76.2 kg)  08/31/17 166 lb 12.8 oz (75.7 kg)     Physical Exam Constitutional:      Appearance: She is well-developed.  HENT:     Head: Normocephalic and atraumatic.  Neck:     Thyroid: No thyromegaly.     Trachea: No tracheal deviation.  Cardiovascular:     Rate and Rhythm: Normal rate.  Pulmonary:     Effort: Pulmonary effort is normal.  Abdominal:     Tenderness: There is no abdominal tenderness. There is no guarding.  Musculoskeletal:        General: Normal range of motion.     Cervical back: Normal range of motion and neck supple.  Skin:    General: Skin is warm and dry.     Coloration: Skin is not pale.     Findings: No erythema or rash.  Neurological:     Mental Status: She is alert and oriented to person, place, and time.  Cranial Nerves: No cranial nerve deficit.     Coordination: Coordination normal.     Deep Tendon Reflexes:  Reflexes are normal and symmetric.  Psychiatric:        Judgment: Judgment normal.       CMP ( most recent) CMP     Component Value Date/Time   NA 141 03/21/2014 1807   K 4.3 03/21/2014 1807   CL 104 03/21/2014 1807   CO2 27 03/21/2014 1807   GLUCOSE 153 (H) 03/21/2014 1807   BUN 21 05/16/2019 0000   CREATININE 1.1 05/16/2019 0000   CREATININE 0.89 03/21/2014 1807   CALCIUM 9.9 03/21/2014 1807   PROT 7.3 03/06/2011 1429   ALBUMIN 3.9 03/06/2011 1429   AST 19 03/06/2011 1429   ALT 14 03/06/2011 1429   ALKPHOS 87 03/06/2011 1429   BILITOT 0.8 03/06/2011 1429   GFRNONAA 54 05/16/2019 0000   GFRAA 62 05/16/2019 0000     Diabetic Labs (most recent): Lab Results  Component Value Date   HGBA1C 8.0 05/16/2019     Lipid Panel ( most recent) Lipid Panel     Component Value Date/Time   CHOL 144 05/16/2019 0000   TRIG 101 05/16/2019 0000   HDL 52 05/16/2019 0000   LDLCALC 72 05/16/2019 0000      Lab Results  Component Value Date   TSH 1.25 03/16/2011           Assessment & Plan:   1. Uncontrolled type 2 diabetes mellitus with hyperglycemia (HCC)  - Bradd Burneratricia F Gaiser has currently uncontrolled symptomatic type 2 DM since  69 years of age,  with most recent A1c of 8 %. Recent labs reviewed. - I had a long discussion with her about the progressive nature of diabetes and the pathology behind its complications. -She does not report gross complications from her diabetes, however she remains at a high risk for more acute and chronic complications which include CAD, CVA, CKD, retinopathy, and neuropathy. These are all discussed in detail with her.  - I have counseled her on diet  and weight management  by adopting a carbohydrate restricted/protein rich diet. Patient is encouraged to switch to  unprocessed or minimally processed     complex starch and increased protein intake (animal or plant source), fruits, and vegetables. -  she is advised to stick to a routine  mealtimes to eat 3 meals  a day and avoid unnecessary snacks ( to snack only to correct hypoglycemia).   - she admits that there is a room for improvement in her food and drink choices. - Suggestion is made for her to avoid simple carbohydrates  from her diet including Cakes, Sweet Desserts, Ice Cream, Soda (diet and regular), Sweet Tea, Candies, Chips, Cookies, Store Bought Juices, Alcohol in Excess of  1-2 drinks a day, Artificial Sweeteners,  Coffee Creamer, and "Sugar-free" Products. This will help patient to have more stable blood glucose profile and potentially avoid unintended weight gain.  - she will be scheduled with Norm SaltPenny Crumpton, RDN, CDE for diabetes education.  - I have approached her with the following individualized plan to manage  her diabetes and patient agrees:   - She is advised to increase her Lantus to 35 units nightly, associated with monitoring of blood glucose twice a day-daily before breakfast and at bedtime.    -She would have benefited from Metformin, however wishes to avoid it.   - she is encouraged to call clinic for blood glucose levels  less than  70 or above 300 mg /dl. - she is advised to continue pioglitazone 15 mg p.o. daily, therapeutically suitable for patient .  - she will be considered for incretin therapy as appropriate next visit.  - Specific targets for  A1c;  LDL, HDL,  and Triglycerides were discussed with the patient.  2) Blood Pressure /Hypertension:  her blood pressure is  controlled to target.   she is advised to continue her current medications including metoprolol 25 mg p.o. daily, telmisartan 80 mg p.o. daily at breakfast.   3) Lipids/Hyperlipidemia:   Review of her recent lipid panel showed  controlled  LDL at 71 .  she  is advised to continue    Crestor 10 mg daily at bedtime.  Side effects and precautions discussed with her.  4)  Weight/Diet:  Body mass index is 29.41 kg/m.  -      she is  a candidate for weight loss. I discussed with her  the fact that loss of 5 - 10% of her  current body weight will have the most impact on her diabetes management.  Exercise, and detailed carbohydrates information provided  -  detailed on discharge instructions.  5) Chronic Care/Health Maintenance:  -she  is on ACEI/ARB and Statin medications and  is encouraged to initiate and continue to follow up with Ophthalmology, Dentist,  Podiatrist at least yearly or according to recommendations, and advised to   stay away from smoking. I have recommended yearly flu vaccine and pneumonia vaccine at least every 5 years; moderate intensity exercise for up to 150 minutes weekly; and  sleep for at least 7 hours a day.  - she is  advised to maintain close follow up with Ralene Ok, MD for primary care needs, as well as her other providers for optimal and coordinated care.   - Time spent in this patient care: 60 min, of which > 50% was spent in  counseling  her about her currently unemployed W diabetes, hyperlipidemia, hypertension and the rest reviewing her blood glucose logs , discussing her hypoglycemia and hyperglycemia episodes, reviewing her current and  previous labs / studies  ( including abstraction from other facilities) and medications  doses and developing a  long term treatment plan based on the latest standards of care/ guidelines; and documenting her care.    Please refer to Patient Instructions for Blood Glucose Monitoring and Insulin/Medications Dosing Guide"  in media tab for additional information. Please  also refer to " Patient Self Inventory" in the Media  tab for reviewed elements of pertinent patient history.  Bradd Burner participated in the discussions, expressed understanding, and voiced agreement with the above plans.  All questions were answered to her satisfaction. she is encouraged to contact clinic should she have any questions or concerns prior to her return visit.   Follow up plan: - Return in about 3 weeks (around 08/28/2019)  for Bring Meter and Logs- A1c in Office.  Marquis Lunch, MD [facility] [address] [place], [place] [address] Phone: [phone]  Fax: [phone]    08/07/2019, 2:53 PM  This note was partially dictated with voice recognition software. Similar sounding words can be transcribed inadequately or may not  be corrected upon review.

## 2019-08-28 ENCOUNTER — Ambulatory Visit (INDEPENDENT_AMBULATORY_CARE_PROVIDER_SITE_OTHER): Payer: Medicare Other | Admitting: "Endocrinology

## 2019-08-28 ENCOUNTER — Other Ambulatory Visit: Payer: Self-pay

## 2019-08-28 ENCOUNTER — Encounter: Payer: Self-pay | Admitting: "Endocrinology

## 2019-08-28 VITALS — BP 110/70 | HR 72 | Ht 63.0 in | Wt 167.6 lb

## 2019-08-28 DIAGNOSIS — I10 Essential (primary) hypertension: Secondary | ICD-10-CM

## 2019-08-28 DIAGNOSIS — E782 Mixed hyperlipidemia: Secondary | ICD-10-CM

## 2019-08-28 DIAGNOSIS — E1165 Type 2 diabetes mellitus with hyperglycemia: Secondary | ICD-10-CM | POA: Diagnosis not present

## 2019-08-28 LAB — POCT GLYCOSYLATED HEMOGLOBIN (HGB A1C): Hemoglobin A1C: 7 % — AB (ref 4.0–5.6)

## 2019-08-28 MED ORDER — INSULIN GLARGINE 100 UNIT/ML ~~LOC~~ SOLN: 35.0000 [IU] | Freq: Every day | SUBCUTANEOUS | 1 refills | Status: DC

## 2019-08-28 NOTE — Progress Notes (Signed)
08/28/2019, 3:37 PM  Endocrinology follow-up note   Subjective:    Patient ID: Sheryl Parker, female    DOB: [birth year].  Sheryl Parker is being seen in follow-up after she was seen in consultation for management of currently uncontrolled symptomatic diabetes requested by  Ralene Ok, MD.   Past Medical History:  Diagnosis Date  . Arthritis   . Attention deficit    Dr. Misty Stanley [place]  . Chronic back pain    hx chronic narcotics- Dr in [place] recent  meds since 2 yrs ago  . Complication of anesthesia   . Disc degeneration   . Diverticulitis   . Fibromyalgia   . Hypertension   . IBS (irritable bowel syndrome)   . Migraine   . Osteoporosis   . PONV (postoperative nausea and vomiting)   . Raynaud disease     Past Surgical History:  Procedure Laterality Date  . I & D EXTREMITY Left 03/23/2014   Procedure: IRRIGATION AND DEBRIDEMENT LEFT FINGER;  Surgeon: Bradly Bienenstock, MD;  Location: [facility];  Service: Orthopedics;  Laterality: Left;  . OOPHORECTOMY  2002   right  . TUBAL LIGATION      Social History   Socioeconomic History  . Marital status: Married    Spouse name: Not on file  . Number of children: 1  . Years of education: Not on file  . Highest education level: Not on file  Occupational History  . Occupation: Surveyor, mining  Tobacco Use  . Smoking status: Former Smoker    Packs/day: 0.20    Years: 8.00    Pack years: 1.60    Types: Cigarettes    Quit date: 01/05/2018    Years since quitting: 1.6  . Smokeless tobacco: Never Used  . Tobacco comment: 1-3 cigarettes  Substance and Sexual Activity  . Alcohol use: No    Comment: HEAVY ETOH X 25 YRS, 8 YRS AGO  . Drug use: No  . Sexual activity: Not on file  Other Topics Concern  . Not on file  Social History Narrative   1 SON-Hep C, polysubstance abuse   Social Determinants of Health    Financial Resource Strain:   . Difficulty of Paying Living Expenses: Not on file  Food Insecurity:   . Worried About Programme researcher, broadcasting/film/video in the Last Year: Not on file  . Ran Out of Food in the Last Year: Not on file  Transportation Needs:   . Lack of Transportation (Medical): Not on file  . Lack of Transportation (Non-Medical): Not on file  Physical Activity:   . Days of Exercise per Week: Not on file  . Minutes of Exercise per Session: Not on file  Stress:   . Feeling of Stress : Not on file  Social Connections:   . Frequency of Communication with Friends and Family: Not on file  . Frequency of Social Gatherings with Friends and Family: Not on file  . Attends Religious Services: Not on file  . Active Member of Clubs or Organizations: Not on file  . Attends Banker Meetings: Not on file  .  Marital Status: Not on file    Family History  Problem Relation Age of Onset  . Hypertension Mother   . Hyperlipidemia Mother   . Hypertension Father   . Hyperlipidemia Father   . Heart attack Father   . Colon polyps Brother   . Cirrhosis Brother        etoh  . Breast cancer Sister   . Liver disease Son        Hepatitis C    Outpatient Encounter Medications as of 08/28/2019  Medication Sig  . ALPRAZolam (XANAX) 1 MG tablet Take 1 tablet by mouth 3 (three) times daily as needed.  Marland Kitchen amphetamine-dextroamphetamine (ADDERALL) 20 MG tablet Take 20 mg by mouth 2 (two) times daily.  . APPLE CIDER VINEGAR PO Take 1 tablet by mouth daily.  . ARIPiprazole (ABILIFY) 5 MG tablet Take 2.5-5 mg by mouth daily.  Marland Kitchen buPROPion (WELLBUTRIN XL) 300 MG 24 hr tablet Take 300 mg by mouth daily.  . Cholecalciferol (VITAMIN D) 2000 UNITS CAPS Take 1 capsule by mouth daily.  . [employer] CINNAMON PO Take 2,000 mg by mouth daily.  Marland Kitchen diltiazem (CARDIZEM CD) 120 MG 24 hr capsule Take 120 mg by mouth daily.  . insulin glargine (LANTUS) 100 UNIT/ML injection Inject 0.35 mLs (35 Units total) into the skin at  bedtime.  . metoprolol succinate (TOPROL-XL) 25 MG 24 hr tablet Take 25 mg by mouth daily.  . Multiple Vitamin (MULITIVITAMIN WITH MINERALS) TABS Take 1 tablet by mouth daily.  . pioglitazone (ACTOS) 15 MG tablet Take 15 mg by mouth daily.  . rizatriptan (MAXALT) 10 MG tablet Take 10 mg by mouth as needed. May repeat in 2 hours if needed for migraines.  . rosuvastatin (CRESTOR) 10 MG tablet Take 10 mg by mouth daily.  Marland Kitchen telmisartan (MICARDIS) 80 MG tablet Take 80 mg by mouth daily.  . temazepam (RESTORIL) 22.5 MG capsule Take 1 capsule by mouth at bedtime.  . vitamin C (ASCORBIC ACID) 250 MG tablet Take 250 mg by mouth daily.  . [DISCONTINUED] ibuprofen (ADVIL,MOTRIN) 200 MG tablet Take 200 mg by mouth every 6 (six) hours as needed for moderate pain.  . [DISCONTINUED] insulin glargine (LANTUS) 100 UNIT/ML injection Inject 35 Units into the skin at bedtime.   No facility-administered encounter medications on file as of 08/28/2019.    ALLERGIES: Allergies  Allergen Reactions  . Penicillins Anaphylaxis  . Sulfa Drugs Cross Reactors Anaphylaxis  . Codeine Nausea And Vomiting  . Levofloxacin Other (See Comments)    Severe sweating and dhydration   . Prednisone Other (See Comments)    Visual disturbances and migraines     VACCINATION STATUS: Immunization History  Administered Date(s) Administered  . Influenza,inj,Quad PF,6+ Mos 10/11/2017, 10/12/2018    Diabetes She presents for her follow-up diabetic visit. She has type 2 diabetes mellitus. Onset time: She was diagnosed at approximate age of 69 years. Her disease course has been improving. There are no hypoglycemic associated symptoms. Pertinent negatives for hypoglycemia include no confusion, headaches, pallor or seizures. Associated symptoms include fatigue. Pertinent negatives for diabetes include no chest pain, no polydipsia, no polyphagia and no polyuria. There are no hypoglycemic complications. Symptoms are improving. There are no  diabetic complications. Risk factors for coronary artery disease include diabetes mellitus, dyslipidemia, hypertension and post-menopausal. Current diabetic treatments: She is currently on Lantus 28 units nightly,, and pioglitazone 50 mg daily. Her weight is fluctuating minimally. She is following a generally unhealthy diet. When asked about meal planning,  she reported none. She participates in exercise intermittently. Her breakfast blood glucose range is generally 140-180 mg/dl. Her overall blood glucose range is 140-180 mg/dl. (She presents with a meter  showing significant improvement in her glycemic profile both fasting and postprandial.  Her point-of-care A1c 7%, improved from 8% during her last visit.   ) An ACE inhibitor/angiotensin II receptor blocker is being taken.  Hyperlipidemia This is a chronic problem. The current episode started more than 1 year ago. The problem is controlled. Pertinent negatives include no chest pain, myalgias or shortness of breath. Current antihyperlipidemic treatment includes statins. Risk factors for coronary artery disease include dyslipidemia, diabetes mellitus, family history, hypertension and post-menopausal.  Hypertension This is a chronic problem. The current episode started more than 1 year ago. Pertinent negatives include no chest pain, headaches, palpitations or shortness of breath. Risk factors for coronary artery disease include diabetes mellitus, dyslipidemia and post-menopausal state. Past treatments include angiotensin blockers.     Review of Systems  Constitutional: Positive for fatigue. Negative for chills, fever and unexpected weight change.  HENT: Negative for trouble swallowing and voice change.   Eyes: Negative for visual disturbance.  Respiratory: Negative for cough, shortness of breath and wheezing.   Cardiovascular: Negative for chest pain, palpitations and leg swelling.  Gastrointestinal: Negative for diarrhea, nausea and vomiting.   Endocrine: Negative for cold intolerance, heat intolerance, polydipsia, polyphagia and polyuria.  Musculoskeletal: Negative for arthralgias and myalgias.  Skin: Negative for color change, pallor, rash and wound.  Neurological: Negative for seizures and headaches.  Psychiatric/Behavioral: Negative for confusion and suicidal ideas.    Objective:    Vitals with BMI 08/28/2019 08/07/2019 02/28/2019  Height 5\' 3"  5\' 3"  5\' 3"   Weight 167 lbs 10 oz 166 lbs 168 lbs  BMI 29.7 29.41 29.77  Systolic 110 128  Diastolic 70 80 81  Pulse 72 80 85  Some encounter information is confidential and restricted. Go to Review Flowsheets activity to see all data.    BP 110/70   Pulse 72   Ht 5\' 3"  (1.6 m)   Wt 167 lb 9.6 oz (76 kg)   BMI 29.69 kg/m   Wt Readings from Last 3 Encounters:  08/28/19 167 lb 9.6 oz (76 kg)  08/07/19 166 lb (75.3 kg)  02/28/19 168 lb (76.2 kg)     Physical Exam Constitutional:      Appearance: She is well-developed.  HENT:     Head: Normocephalic and atraumatic.  Neck:     Thyroid: No thyromegaly.     Trachea: No tracheal deviation.  Cardiovascular:     Rate and Rhythm: Normal rate.  Pulmonary:     Effort: Pulmonary effort is normal.  Abdominal:     Tenderness: There is no abdominal tenderness. There is no guarding.  Musculoskeletal:        General: Normal range of motion.     Cervical back: Normal range of motion and neck supple.  Skin:    General: Skin is warm and dry.     Coloration: Skin is not pale.     Findings: No erythema or rash.  Neurological:     Mental Status: She is alert and oriented to person, place, and time.     Cranial Nerves: No cranial nerve deficit.     Coordination: Coordination normal.     Deep Tendon Reflexes: Reflexes are normal and symmetric.  Psychiatric:        Judgment: Judgment normal.     CMP (  most recent) CMP     Component Value Date/Time   NA 141 03/21/2014 1807   K 4.3 03/21/2014 1807   CL 104 03/21/2014 1807    CO2 27 03/21/2014 1807   GLUCOSE 153 (H) 03/21/2014 1807   BUN 21 05/16/2019 0000   CREATININE 1.1 05/16/2019 0000   CREATININE 0.89 03/21/2014 1807   CALCIUM 9.9 03/21/2014 1807   PROT 7.3 03/06/2011 1429   ALBUMIN 3.9 03/06/2011 1429   AST 19 03/06/2011 1429   ALT 14 03/06/2011 1429   ALKPHOS 87 03/06/2011 1429   BILITOT 0.8 03/06/2011 1429   GFRNONAA 54 05/16/2019 0000   GFRAA 62 05/16/2019 0000     Diabetic Labs (most recent): Lab Results  Component Value Date   HGBA1C 7.0 (A) 08/28/2019   HGBA1C 8.0 05/16/2019     Lipid Panel ( most recent) Lipid Panel     Component Value Date/Time   CHOL 144 05/16/2019 0000   TRIG 101 05/16/2019 0000   HDL 52 05/16/2019 0000   LDLCALC 72 05/16/2019 0000      Lab Results  Component Value Date   TSH 1.25 03/16/2011       Assessment & Plan:   1. Uncontrolled type 2 diabetes mellitus with hyperglycemia (HCC)  - JALISIA PUCHALSKI has currently uncontrolled symptomatic type 2 DM since  69 years of age.  She presents with a meter  showing significant improvement in her glycemic profile both fasting and postprandial.  Her point-of-care A1c 7%, improved from 8% during her last visit.  - I had a long discussion with her about the progressive nature of diabetes and the pathology behind its complications. -She does not report gross complications from her diabetes, however she remains at a high risk for more acute and chronic complications which include CAD, CVA, CKD, retinopathy, and neuropathy. These are all discussed in detail with her.  - I have counseled her on diet  and weight management  by adopting a carbohydrate restricted/protein rich diet. Patient is encouraged to switch to  unprocessed or minimally processed     complex starch and increased protein intake (animal or plant source), fruits, and vegetables. -  she is advised to stick to a routine mealtimes to eat 3 meals  a day and avoid unnecessary snacks ( to snack only to  correct hypoglycemia).   - she  admits there is a room for improvement in her diet and drink choices. -  Suggestion is made for her to avoid simple carbohydrates  from her diet including Cakes, Sweet Desserts / Pastries, Ice Cream, Soda (diet and regular), Sweet Tea, Candies, Chips, Cookies, Sweet Pastries,  Store Bought Juices, Alcohol in Excess of  1-2 drinks a day, Artificial Sweeteners, Coffee Creamer, and "Sugar-free" Products. This will help patient to have stable blood glucose profile and potentially avoid unintended weight gain.   - she will be scheduled with Norm Salt, RDN, CDE for diabetes education.  - I have approached her with the following individualized plan to manage  her diabetes and patient agrees:   - She is advised to continue Lantus 35 units nightly,  associated with monitoring of blood glucose twice a day-daily before breakfast and at bedtime.    -She did not tolerate Metformin.   - she is encouraged to call clinic for blood glucose levels  less than 70 or above 200 mg /dl. - she is advised to continue pioglitazone 15 mg p.o. daily, therapeutically suitable for patient .  - she  will be considered for incretin therapy as appropriate next visit.  - Specific targets for  A1c;  LDL, HDL,  and Triglycerides were discussed with the patient.  2) Blood Pressure /Hypertension:   Blood pressure is controlled to target.  she is advised to continue her current medications including metoprolol 25 mg p.o. daily, telmisartan 80 mg p.o. daily at breakfast.   3) Lipids/Hyperlipidemia:   Review of her recent lipid panel showed  controlled  LDL at 71 .  she  is advised to continue    Crestor 10 mg daily at bedtime.  Side effects and precautions discussed with her.  4)  Weight/Diet:  Body mass index is 29.69 kg/m.  -      she is  a candidate for weight loss. I discussed with her the fact that loss of 5 - 10% of her  current body weight will have the most impact on her diabetes  management.  Exercise, and detailed carbohydrates information provided  -  detailed on discharge instructions.  5) Chronic Care/Health Maintenance:  -she  is on ACEI/ARB and Statin medications and  is encouraged to initiate and continue to follow up with Ophthalmology, Dentist,  Podiatrist at least yearly or according to recommendations, and advised to   stay away from smoking. I have recommended yearly flu vaccine and pneumonia vaccine at least every 5 years; moderate intensity exercise for up to 150 minutes weekly; and  sleep for at least 7 hours a day.  - she is  advised to maintain close follow up with Ralene OkMoreira, Roy, MD for primary care needs, as well as her other providers for optimal and coordinated care.   - Time spent on this patient care encounter:  35 min, of which > 50% was spent in  counseling and the rest reviewing her blood glucose logs , discussing her hypoglycemia and hyperglycemia episodes, reviewing her current and  previous labs / studies  ( including abstraction from other facilities) and medications  doses and developing a  long term treatment plan and documenting her care.   Please refer to Patient Instructions for Blood Glucose Monitoring and Insulin/Medications Dosing Guide"  in media tab for additional information. Please  also refer to " Patient Self Inventory" in the Media  tab for reviewed elements of pertinent patient history.  Bradd BurnerPatricia F Jablonowski participated in the discussions, expressed understanding, and voiced agreement with the above plans.  All questions were answered to her satisfaction. she is encouraged to contact clinic should she have any questions or concerns prior to her return visit.   Follow up plan: - Return in about 4 months (around 12/28/2019) for F/U with Pre-visit Labs, Meter, Logs, A1c here.Marquis Lunch.  Gebre Janis Cuffe, MD [facility] [address] [place], [place] [address] Phone: [phone]  Fax:  [phone]    08/28/2019, 3:37 PM  This note was partially dictated with voice recognition software. Similar sounding words can be transcribed inadequately or may not  be corrected upon review.

## 2019-08-28 NOTE — Patient Instructions (Signed)

## 2019-08-29 ENCOUNTER — Ambulatory Visit
Admission: RE | Admit: 2019-08-29 | Discharge: 2019-08-29 | Disposition: A | Discharge status: Home / Self Care | Payer: Medicare Other | Source: Ambulatory Visit | Attending: Nephrology | Admitting: Nephrology

## 2019-08-29 DIAGNOSIS — N1831 Chronic kidney disease, stage 3a: Secondary | ICD-10-CM

## 2019-09-26 ENCOUNTER — Other Ambulatory Visit: Payer: Self-pay

## 2019-09-26 ENCOUNTER — Encounter: Payer: Medicare Other | Attending: Internal Medicine | Admitting: Nutrition

## 2019-09-26 ENCOUNTER — Encounter: Payer: Self-pay | Admitting: Nutrition

## 2019-09-26 VITALS — Ht 63.0 in | Wt 172.0 lb

## 2019-09-26 DIAGNOSIS — E669 Obesity, unspecified: Secondary | ICD-10-CM | POA: Insufficient documentation

## 2019-09-26 DIAGNOSIS — E1165 Type 2 diabetes mellitus with hyperglycemia: Secondary | ICD-10-CM

## 2019-09-26 DIAGNOSIS — E782 Mixed hyperlipidemia: Secondary | ICD-10-CM | POA: Diagnosis present

## 2019-09-26 NOTE — Patient Instructions (Addendum)
Goals  Follow MY Plate Eat meals on time Eat 30-45 g CHO at meals Exercise 30-60 minutes 4-5 times per week. Keep drinking water Lose 2 lbs per month. Get A1C to 6.5% or less.

## 2019-09-26 NOTE — Progress Notes (Signed)
  Medical Nutrition Therapy:  Appt start time: 1530 end time:  1630.   Assessment:  Primary concerns today: DM Type 2.  Lives with her husband. She cooks and shops. Sees Dr. Fransico Him. . Lantus 35 units. FBS: 80-110's. Bedtime: 116-120's. Recently changed:  Taking insulin and eating better. And eating meals on time. Lab Results  Component Value Date   HGBA1C 7.0 (A) 08/28/2019   CMP Latest Ref Rng & Units 05/16/2019 03/21/2014 03/06/2011  Glucose 70 - 99 mg/dL - 253(G) 644(I)  BUN 4 - 21 21 11 12   Creatinine 0.5 - 1.1 1.1 0.89 0.79  Sodium 135 - 145 mmol/L - 141 139  Potassium 3.5 - 5.1 mmol/L - 4.3 3.8  Chloride 96 - 112 mmol/L - 104 100  CO2 19 - 32 mmol/L - 27 28  Calcium 8.4 - 10.5 mg/dL - 9.9  Total Protein 6.0 - 8.3 g/dL - - 7.3  Total Bilirubin 0.3 - 1.2 mg/dL - - 0.8  Alkaline Phos 39 - 117 U/L - - 87  AST 0 - 37 U/L - - 19  ALT 0 - 35 U/L - - 14     Preferred Learning Style:  No preference indicated   Learning Readiness:   Ready  Change in progress   MEDICATIONS:    DIETARY INTAKE:   24-hr recall:  B ( AM):1/2 english muffin and cheese  L ( PM): Toasted muffin with butter, string beans,  Snk ( PM): D ( PM): Corn on cob, green peas, mixed beans,  Snk ( PM):  Beverages: Water   Usual physical activity: Treadmilll 10-20 minutes daily.  Estimated energy needs: 1200 calories 135 g carbohydrates 90 g protein 33 g fat  Progress Towards Goal(s):  In progress.   Nutritional Diagnosis:  NB-1.1 Food and nutrition-related knowledge deficit As related to Diabetes Type 2.  As evidenced by A1C 7%.    Intervention:  Nutrition and Diabetes education provided on My Plate, CHO counting, meal planning, portion sizes, timing of meals, avoiding snacks between meals unless having a low blood sugar, target ranges for A1C and blood sugars, signs/symptoms and treatment of hyper/hypoglycemia, monitoring blood sugars, taking medications as prescribed, benefits of  exercising 30 minutes per day and prevention of complications of DM.  34.7Goals  Follow MY Plate Eat meals on time Eat 30-45 g CHO at meals Exercise 30-60 minutes 4-5 times per week. Keep drinking water Lose 2 lbs per month. Get A1C to 6.5% or less.   Teaching Method Utilized:  Visual Auditory Hands on  Handouts given during visit include:  The Plate Method   Meal Plan Card  Diabetes Instructions.       Barriers to learning/adherence to lifestyle change: none  Demonstrated degree of understanding via:  Teach Back   Monitoring/Evaluation:  Dietary intake, exercise, , and body weight in 1 month(s).

## 2019-12-20 ENCOUNTER — Other Ambulatory Visit: Payer: Self-pay | Admitting: "Endocrinology

## 2019-12-21 LAB — COMPLETE METABOLIC PANEL WITH GFR
AG Ratio: 1.7 (calc) (ref 1.0–2.5)
ALT: 23 U/L (ref 6–29)
AST: 20 U/L (ref 10–35)
Albumin: 4.3 g/dL (ref 3.6–5.1)
Alkaline phosphatase (APISO): 122 U/L (ref 37–153)
BUN/Creatinine Ratio: 13 (calc) (ref 6–22)
BUN: 21 mg/dL (ref 7–25)
CO2: 27 mmol/L (ref 20–32)
Calcium: 10.5 mg/dL — ABNORMAL HIGH (ref 8.6–10.4)
Chloride: 105 mmol/L (ref 98–110)
Creat: 1.62 mg/dL — ABNORMAL HIGH (ref 0.50–0.99)
GFR, Est African American: 37 mL/min/{1.73_m2} — ABNORMAL LOW (ref >=60)
GFR, Est Non African American: 32 mL/min/{1.73_m2} — ABNORMAL LOW (ref >=60)
Globulin: 2.6 g/dL (calc) (ref 1.9–3.7)
Glucose, Bld: 180 mg/dL — ABNORMAL HIGH (ref 65–99)
Potassium: 5.9 mmol/L — ABNORMAL HIGH (ref 3.5–5.3)
Sodium: 140 mmol/L (ref 135–146)
Total Bilirubin: 0.7 mg/dL (ref 0.2–1.2)
Total Protein: 6.9 g/dL (ref 6.1–8.1)

## 2019-12-21 LAB — T4, FREE: Free T4: 0.9 ng/dL (ref 0.8–1.8)

## 2019-12-21 LAB — TSH: TSH: 3.19 mIU/L (ref 0.40–4.50)

## 2019-12-25 ENCOUNTER — Other Ambulatory Visit: Payer: Self-pay

## 2019-12-25 ENCOUNTER — Encounter: Payer: Medicare Other | Attending: "Endocrinology | Admitting: Nutrition

## 2019-12-25 ENCOUNTER — Encounter: Payer: Self-pay | Admitting: Nutrition

## 2019-12-25 VITALS — Ht 63.0 in | Wt 168.0 lb

## 2019-12-25 DIAGNOSIS — E1165 Type 2 diabetes mellitus with hyperglycemia: Secondary | ICD-10-CM

## 2019-12-25 DIAGNOSIS — I10 Essential (primary) hypertension: Secondary | ICD-10-CM | POA: Diagnosis present

## 2019-12-25 DIAGNOSIS — E782 Mixed hyperlipidemia: Secondary | ICD-10-CM | POA: Diagnosis present

## 2019-12-25 NOTE — Patient Instructions (Signed)
Goal  Eat meals on time B)6-8  L)12-2 and D) 5-7 Exercise 30 - 50  Minutes 3-4 times per week. Don't skip meals. Try to get back into [employer], Goals FBS 80-130 and Bedtime: 100-150. Drink 100 oz of water per day Plan meals and shopping list.

## 2019-12-25 NOTE — Progress Notes (Signed)
  Medical Nutrition Therapy:  Appt start time: 1330 end time:  1400 Assessment:  Primary concerns today: DM Type 2.  Lives with her husband. She cooks and shops. Sees Ronny Bacon, FNP at [facility]. . Currently taking Lantus 35 units and Actos daily. FBS: 80-90's s. Bedtime: 200's Recently changed:  Taking insulin but tends to skip lunch due to taking care of family members. She notes she needs to put herself first and start taking care of her and eat better instead of trying to please and take care of others. Has been depressed due to losing a family member. Started seeing a therapist. Wants to get back to going to [employer].  Wants to get back into [employer]. Is depressed and sees a therapist. She notes she blood sugars are high at night due to skipping lunch and over eating at night.   Lab Results  Component Value Date   HGBA1C 7.0 (A) 08/28/2019   CMP Latest Ref Rng & Units 12/20/2019 05/16/2019 03/21/2014  Glucose 65 - 99 mg/dL 299(B) - 716(R)  BUN 7 - 25 mg/dL 21 21 11   Creatinine 0.50 - 0.99 mg/dL ) 1.1 6.78(L  Sodium 135 - 146 mmol/L 140 - 141  Potassium 3.5 - 5.3 mmol/L 5.9(H) - 4.3  Chloride 98 - 110 mmol/L 105 - 104  CO2 20 - 32 mmol/L 27 - 27  Calcium 8.6 - 10.4 mg/dL 10.5(H) - 9.9  Total Protein 6.1 - 8.1 g/dL 6.9 - -  Total Bilirubin 0.2 - 1.2 mg/dL 0.7 - -  Alkaline Phos 39 - 117 U/L - - -  AST 10 - 35 U/L 20 - -  ALT 6 - 29 U/L 23 - -     Preferred Learning Style:  No preference indicated   Learning Readiness:   Ready  Change in progress   MEDICATIONS:    DIETARY INTAKE:   24-hr recall:  B ( AM):1/2 english muffin and cheese  L ( PM): Toasted muffin with butter, string beans,  Snk ( PM): D ( PM): Corn on cob, green peas, mixed beans,  Snk ( PM):  Beverages: Water   Usual physical activity: Treadmilll 10-20 minutes daily.  Estimated energy needs: 1200 calories 135 g carbohydrates 90 g protein 33 g fat  Progress Towards Goal(s):  In  progress.   Nutritional Diagnosis:  NB-1.1 Food and nutrition-related knowledge deficit As related to Diabetes Type 2.  As evidenced by A1C 7%.    Intervention:  Nutrition and Diabetes education provided on My Plate, CHO counting, meal planning, portion sizes, timing of meals, avoiding snacks between meals unless having a low blood sugar, target ranges for A1C and blood sugars, signs/symptoms and treatment of hyper/hypoglycemia, monitoring blood sugars, taking medications as prescribed, benefits of exercising 30 minutes per day and prevention of complications of DM.   Goal  Eat meals on time B)6-8  L)12-2 and D) 5-7 Exercise 30 - 50  Minutes 3-4 times per week. Don't skip meals. Try to get back into [employer], Goals FBS 80-130 and Bedtime: 100-150. Drink 100 oz of water per day Plan meals and shopping list.   Teaching Method Utilized:  Visual Auditory Hands on  Handouts given during visit include:  The Plate Method   Meal Plan Card  Diabetes Instructions.       Barriers to learning/adherence to lifestyle change: none  Demonstrated degree of understanding via:  Teach Back   Monitoring/Evaluation:  Dietary intake, exercise, , and body weight in 3 month(s).

## 2020-01-01 ENCOUNTER — Ambulatory Visit (INDEPENDENT_AMBULATORY_CARE_PROVIDER_SITE_OTHER): Payer: Medicare Other | Admitting: "Endocrinology

## 2020-01-01 ENCOUNTER — Ambulatory Visit: Payer: Medicare Other | Admitting: Nutrition

## 2020-01-01 ENCOUNTER — Encounter: Payer: Self-pay | Admitting: "Endocrinology

## 2020-01-01 ENCOUNTER — Other Ambulatory Visit: Payer: Self-pay

## 2020-01-01 VITALS — BP 140/84 | HR 63 | Ht 63.0 in | Wt 168.0 lb

## 2020-01-01 DIAGNOSIS — N182 Chronic kidney disease, stage 2 (mild): Secondary | ICD-10-CM

## 2020-01-01 DIAGNOSIS — Z794 Long term (current) use of insulin: Secondary | ICD-10-CM

## 2020-01-01 DIAGNOSIS — I10 Essential (primary) hypertension: Secondary | ICD-10-CM

## 2020-01-01 DIAGNOSIS — E782 Mixed hyperlipidemia: Secondary | ICD-10-CM

## 2020-01-01 DIAGNOSIS — E1122 Type 2 diabetes mellitus with diabetic chronic kidney disease: Secondary | ICD-10-CM

## 2020-01-01 LAB — POCT GLYCOSYLATED HEMOGLOBIN (HGB A1C): HbA1c, POC (controlled diabetic range): 7.7 % — AB (ref 0.0–7.0)

## 2020-01-01 NOTE — Patient Instructions (Signed)

## 2020-01-01 NOTE — Progress Notes (Signed)
01/01/2020, 2:14 PM  Endocrinology follow-up note   Subjective:    Patient ID: Sheryl Parker, female    DOB: [birth year].  Sheryl Parker is being seen in follow-up after she was seen in consultation for management of currently uncontrolled symptomatic diabetes requested by  Ralene Ok, MD.   Past Medical History:  Diagnosis Date  . Arthritis   . Attention deficit    Dr. Misty Stanley [place]  . Chronic back pain    hx chronic narcotics- Dr in [place] recent  meds since 2 yrs ago  . Complication of anesthesia   . Disc degeneration   . Diverticulitis   . Fibromyalgia   . Hypertension   . IBS (irritable bowel syndrome)   . Migraine   . Osteoporosis   . PONV (postoperative nausea and vomiting)   . Raynaud disease     Past Surgical History:  Procedure Laterality Date  . I & D EXTREMITY Left 03/23/2014   Procedure: IRRIGATION AND DEBRIDEMENT LEFT FINGER;  Surgeon: Bradly Bienenstock, MD;  Location: [facility];  Service: Orthopedics;  Laterality: Left;  . OOPHORECTOMY  2002   right  . TUBAL LIGATION      Social History   Socioeconomic History  . Marital status: Married    Spouse name: Not on file  . Number of children: 1  . Years of education: Not on file  . Highest education level: Not on file  Occupational History  . Occupation: Surveyor, mining  Tobacco Use  . Smoking status: Former Smoker    Packs/day: 0.20    Years: 8.00    Pack years: 1.60    Types: Cigarettes    Quit date: 01/05/2018    Years since quitting: 1.9  . Smokeless tobacco: Never Used  . Tobacco comment: 1-3 cigarettes  Substance and Sexual Activity  . Alcohol use: No    Comment: HEAVY ETOH X 25 YRS, 8 YRS AGO  . Drug use: No  . Sexual activity: Not on file  Other Topics Concern  . Not on file  Social History Narrative   1 SON-Hep C, polysubstance abuse   Social Determinants of Health    Financial Resource Strain: Not on file  Food Insecurity: Not on file  Transportation Needs: Not on file  Physical Activity: Not on file  Stress: Not on file  Social Connections: Not on file    Family History  Problem Relation Age of Onset  . Hypertension Mother   . Hyperlipidemia Mother   . Hypertension Father   . Hyperlipidemia Father   . Heart attack Father   . Colon polyps Brother   . Cirrhosis Brother        etoh  . Breast cancer Sister   . Liver disease Son        Hepatitis C    Outpatient Encounter Medications as of 01/01/2020  Medication Sig  . ALPRAZolam (XANAX) 1 MG tablet Take 1 tablet by mouth 3 (three) times daily as needed.  Marland Kitchen amphetamine-dextroamphetamine (ADDERALL) 20 MG tablet Take 20 mg by mouth 2 (two) times daily.  . APPLE CIDER VINEGAR PO Take  1 tablet by mouth daily.  . ARIPiprazole (ABILIFY) 5 MG tablet Take 2.5-5 mg by mouth daily.  Marland Kitchen buPROPion (WELLBUTRIN XL) 300 MG 24 hr tablet Take 300 mg by mouth daily.  . [facility] CINNAMON PO Take 2,000 mg by mouth daily.  Marland Kitchen diltiazem (CARDIZEM CD) 120 MG 24 hr capsule Take 120 mg by mouth daily.  . insulin glargine (LANTUS) 100 UNIT/ML injection Inject 0.35 mLs (35 Units total) into the skin at bedtime.  . metoprolol succinate (TOPROL-XL) 25 MG 24 hr tablet Take 25 mg by mouth daily.  . Multiple Vitamin (MULITIVITAMIN WITH MINERALS) TABS Take 1 tablet by mouth daily.  . pioglitazone (ACTOS) 15 MG tablet Take 15 mg by mouth daily.  . rizatriptan (MAXALT) 10 MG tablet Take 10 mg by mouth as needed. May repeat in 2 hours if needed for migraines.  . rosuvastatin (CRESTOR) 10 MG tablet Take 10 mg by mouth daily.  Marland Kitchen telmisartan (MICARDIS) 80 MG tablet Take 80 mg by mouth daily.  . temazepam (RESTORIL) 22.5 MG capsule Take 1 capsule by mouth at bedtime.  . vitamin C (ASCORBIC ACID) 250 MG tablet Take 250 mg by mouth daily.  . [DISCONTINUED] Cholecalciferol (VITAMIN D) 2000 UNITS CAPS Take 1 capsule by mouth daily.   No  facility-administered encounter medications on file as of 01/01/2020.    ALLERGIES: Allergies  Allergen Reactions  . Penicillins Anaphylaxis  . Sulfa Drugs Cross Reactors Anaphylaxis  . Codeine Nausea And Vomiting  . Levofloxacin Other (See Comments)    Severe sweating and dhydration   . Prednisone Other (See Comments)    Visual disturbances and migraines     VACCINATION STATUS: Immunization History  Administered Date(s) Administered  . Influenza,inj,Quad PF,6+ Mos 10/11/2017, 10/12/2018    Diabetes She presents for her follow-up diabetic visit. She has type 2 diabetes mellitus. Onset time: She was diagnosed at approximate age of 17 years. Her disease course has been worsening. There are no hypoglycemic associated symptoms. Pertinent negatives for hypoglycemia include no confusion, headaches, pallor or seizures. Associated symptoms include fatigue. Pertinent negatives for diabetes include no chest pain, no polydipsia, no polyphagia and no polyuria. There are no hypoglycemic complications. Symptoms are worsening. There are no diabetic complications. Risk factors for coronary artery disease include diabetes mellitus, dyslipidemia, hypertension and post-menopausal. Current diabetic treatments: She is currently on Lantus 28 units nightly,, and pioglitazone 50 mg daily. Her weight is stable. She is following a generally unhealthy diet. When asked about meal planning, she reported none. She participates in exercise intermittently. Her home blood glucose trend is increasing steadily. Her overall blood glucose range is 140-180 mg/dl. ( ) An ACE inhibitor/angiotensin II receptor blocker is being taken.  Hyperlipidemia This is a chronic problem. The current episode started more than 1 year ago. The problem is controlled. Pertinent negatives include no chest pain, myalgias or shortness of breath. Current antihyperlipidemic treatment includes statins. Risk factors for coronary artery disease include  dyslipidemia, diabetes mellitus, family history, hypertension and post-menopausal.  Hypertension This is a chronic problem. The current episode started more than 1 year ago. Pertinent negatives include no chest pain, headaches, palpitations or shortness of breath. Risk factors for coronary artery disease include diabetes mellitus, dyslipidemia and post-menopausal state. Past treatments include angiotensin blockers.     Review of Systems  Constitutional: Positive for fatigue. Negative for chills, fever and unexpected weight change.  HENT: Negative for trouble swallowing and voice change.   Eyes: Negative for visual disturbance.  Respiratory: Negative for  cough, shortness of breath and wheezing.   Cardiovascular: Negative for chest pain, palpitations and leg swelling.  Gastrointestinal: Negative for diarrhea, nausea and vomiting.  Endocrine: Negative for cold intolerance, heat intolerance, polydipsia, polyphagia and polyuria.  Musculoskeletal: Negative for arthralgias and myalgias.  Skin: Negative for color change, pallor, rash and wound.  Neurological: Negative for seizures and headaches.  Psychiatric/Behavioral: Negative for confusion and suicidal ideas.    Objective:    Vitals with BMI 01/01/2020 12/25/2019 09/26/2019  Height 5\' 3"  5\' 3"  5\' 3"   Weight 168 lbs 168 lbs 172 lbs  BMI 29.77 29.77 30.48  Systolic 140 - -  Diastolic 84 - -  Pulse 63 - -  Some encounter information is confidential and restricted. Go to Review Flowsheets activity to see all data.    BP 140/84   Pulse 63   Ht 5\' 3"  (1.6 m)   Wt 168 lb (76.2 kg)   BMI 29.76 kg/m   Wt Readings from Last 3 Encounters:  01/01/20 168 lb (76.2 kg)  12/25/19 168 lb (76.2 kg)  09/26/19 172 lb (78 kg)     Physical Exam Constitutional:      Appearance: She is well-developed.  HENT:     Head: Normocephalic and atraumatic.  Neck:     Thyroid: No thyromegaly.     Trachea: No tracheal deviation.  Cardiovascular:     Rate  and Rhythm: Normal rate.  Pulmonary:     Effort: Pulmonary effort is normal.  Abdominal:     Tenderness: There is no abdominal tenderness. There is no guarding.  Musculoskeletal:        General: Normal range of motion.     Cervical back: Normal range of motion and neck supple.  Skin:    General: Skin is warm and dry.     Coloration: Skin is not pale.     Findings: No erythema or rash.  Neurological:     Mental Status: She is alert and oriented to person, place, and time.     Cranial Nerves: No cranial nerve deficit.     Coordination: Coordination normal.     Deep Tendon Reflexes: Reflexes are normal and symmetric.  Psychiatric:        Judgment: Judgment normal.     CMP ( most recent) CMP     Component Value Date/Time   NA 140 12/20/2019 1113   K 5.9 (H) 12/20/2019 1113   CL 105 12/20/2019 1113   CO2 27 12/20/2019 1113   GLUCOSE 180 (H) 12/20/2019 1113   BUN 21 12/20/2019 1113   BUN 21 05/16/2019 0000   CREATININE 1.62 (H) 12/20/2019 1113   CALCIUM 10.5 (H) 12/20/2019 1113   PROT 6.9 12/20/2019 1113   ALBUMIN 3.9 03/06/2011 1429   AST 20 12/20/2019 1113   ALT 23 12/20/2019 1113   ALKPHOS 87 03/06/2011 1429   BILITOT 0.7 12/20/2019 1113   GFRNONAA 32 (L) 12/20/2019 1113   GFRAA 37 (L) 12/20/2019 1113     Diabetic Labs (most recent): Lab Results  Component Value Date   HGBA1C 7.7 (A) 01/01/2020   HGBA1C 7.0 (A) 08/28/2019   HGBA1C 8.0 05/16/2019     Lipid Panel ( most recent) Lipid Panel     Component Value Date/Time   CHOL 144 05/16/2019 0000   TRIG 101 05/16/2019 0000   HDL 52 05/16/2019 0000   LDLCALC 72 05/16/2019 0000      Lab Results  Component Value Date   TSH 3.19 12/20/2019  TSH 1.25 03/16/2011   FREET4 0.9 12/20/2019       Assessment & Plan:   1. Uncontrolled type 2 diabetes mellitus with hyperglycemia (HCC)  - Sheryl Parker has currently uncontrolled symptomatic type 2 DM since  69 years of age.  She presents without her logs  nor meter.  Her point-of-care A1c was 7.7% increasing from 7%.  She denies hypoglycemia.   She admits to inconsistency using her Lantus.  - I had a long discussion with her about the progressive nature of diabetes and the pathology behind its complications. -She does not report gross complications from her diabetes, however she remains at a high risk for more acute and chronic complications which include CAD, CVA, CKD, retinopathy, and neuropathy. These are all discussed in detail with her.  - I have counseled her on diet  and weight management  by adopting a carbohydrate restricted/protein rich diet. Patient is encouraged to switch to  unprocessed or minimally processed     complex starch and increased protein intake (animal or plant source), fruits, and vegetables. -  she is advised to stick to a routine mealtimes to eat 3 meals  a day and avoid unnecessary snacks ( to snack only to correct hypoglycemia).   - she acknowledges that there is a room for improvement in her food and drink choices. - Suggestion is made for her to avoid simple carbohydrates  from her diet including Cakes, Sweet Desserts, Ice Cream, Soda (diet and regular), Sweet Tea, Candies, Chips, Cookies, Store Bought Juices, Alcohol in Excess of  1-2 drinks a day, Artificial Sweeteners,  Coffee Creamer, and "Sugar-free" Products, Lemonade. This will help patient to have more stable blood glucose profile and potentially avoid unintended weight gain.  - she will be scheduled with Norm SaltPenny Crumpton, RDN, CDE for diabetes education.  - I have approached her with the following individualized plan to manage  her diabetes and patient agrees:   - She is advised to resume and be consistent using her Lantus 35 units nightly, associated with monitoring of blood glucose twice a day-daily before breakfast and at bedtime.    -She did not tolerate Metformin.   - she is encouraged to call clinic for blood glucose levels  less than 70 or above 200 mg  /dl. - she is advised to continue pioglitazone 15 mg p.o. daily, therapeutically suitable for patient .  - she will be considered for incretin therapy as appropriate next visit.  - Specific targets for  A1c;  LDL, HDL,  and Triglycerides were discussed with the patient.  2) Blood Pressure /Hypertension:   Her blood pressure is controlled to target.   she is advised to continue her current medications including metoprolol 25 mg p.o. daily, telmisartan 80 mg p.o. daily at breakfast.    3) Lipids/Hyperlipidemia:   Review of her recent lipid panel showed  controlled  LDL at 71 .  she  is advised to continue Crestor 10 mg p.o. daily at bedtime. Side effects and precautions discussed with her.  4)  Weight/Diet:  Body mass index is 29.76 kg/m.  -      she is  a candidate for weight loss. I discussed with her the fact that loss of 5 - 10% of her  current body weight will have the most impact on her diabetes management.  Exercise, and detailed carbohydrates information provided  -  detailed on discharge instructions.  5) Chronic Care/Health Maintenance:  -she  is on ACEI/ARB and Statin medications  and  is encouraged to initiate and continue to follow up with Ophthalmology, Dentist,  Podiatrist at least yearly or according to recommendations, and advised to   stay away from smoking. I have recommended yearly flu vaccine and pneumonia vaccine at least every 5 years; moderate intensity exercise for up to 150 minutes weekly; and  sleep for at least 7 hours a day. -She is not using any potassium supplements.  She is advised to cut back on potassium rich food items such as oranges, bananas.  She will need repeat CMP before her next visit.   POCT ABI Results 01/01/20  Her ABI is normal today. Right ABI: 1.12      left ABI: 1.13  Right leg systolic / diastolic: 157/75 mmHg Left leg systolic / diastolic: 158/78 mmHg  Arm systolic / diastolic: 140/84 mmHG This study will be repeated in 5 years, or sooner  if needed.   - she is  advised to maintain close follow up with Ralene Ok, MD for primary care needs, as well as her other providers for optimal and coordinated care.   - Time spent on this patient care encounter:  35 min, of which > 50% was spent in  counseling and the rest reviewing her blood glucose logs , discussing her hypoglycemia and hyperglycemia episodes, reviewing her current and  previous labs / studies  ( including abstraction from other facilities) and medications  doses and developing a  long term treatment plan and documenting her care.   Please refer to Patient Instructions for Blood Glucose Monitoring and Insulin/Medications Dosing Guide"  in media tab for additional information. Please  also refer to " Patient Self Inventory" in the Media  tab for reviewed elements of pertinent patient history.  Sheryl Burner participated in the discussions, expressed understanding, and voiced agreement with the above plans.  All questions were answered to her satisfaction. she is encouraged to contact clinic should she have any questions or concerns prior to her return visit.   Follow up plan: - Return for F/U with Pre-visit Labs, Meter, Logs, A1c here.Marquis Lunch, MD [facility] [address] [place], [place] [address] Phone: [phone]  Fax: [phone]    01/01/2020, 2:14 PM  This note was partially dictated with voice recognition software. Similar sounding words can be transcribed inadequately or may not  be corrected upon review.

## 2020-01-03 ENCOUNTER — Other Ambulatory Visit: Payer: Self-pay | Admitting: "Endocrinology

## 2020-03-08 ENCOUNTER — Other Ambulatory Visit: Payer: Self-pay | Admitting: "Endocrinology

## 2020-04-02 ENCOUNTER — Ambulatory Visit: Payer: Medicare Other | Admitting: "Endocrinology

## 2020-04-03 ENCOUNTER — Encounter: Payer: Medicare Other | Attending: "Endocrinology | Admitting: Nutrition

## 2020-04-03 DIAGNOSIS — E1165 Type 2 diabetes mellitus with hyperglycemia: Secondary | ICD-10-CM | POA: Insufficient documentation

## 2020-04-03 DIAGNOSIS — E669 Obesity, unspecified: Secondary | ICD-10-CM

## 2020-04-03 DIAGNOSIS — I10 Essential (primary) hypertension: Secondary | ICD-10-CM | POA: Insufficient documentation

## 2020-04-03 DIAGNOSIS — E782 Mixed hyperlipidemia: Secondary | ICD-10-CM | POA: Diagnosis present

## 2020-04-03 NOTE — Patient Instructions (Signed)
  Goal  Eat meals on time B)6-8  L)12-2 and D) 5-7 Make appointment with your therapist. Don't skip meals. Try to get back into Alanon, Goals FBS 80-130 and Bedtime: 100-150. Drink 100 oz of water per day Plan meals and shopping list.

## 2020-04-03 NOTE — Progress Notes (Signed)
  Medical Nutrition Therapy:  Appt start time: 1430 end time:  1600  Assessment:  Primary concerns today: DM Type 2.  Lives with her husband. She cooks and shops. Sees Dr. Fransico Him.  She notes her life has been very stressful for the last 3 months dealing with family members. She notes she is physically and mentally exhausted. Her two sons are 55 and 89.  She is stressed out and can't take care of what she needs to for herself and it effects her blood sugars. She is chronically fatigued. Doesn't sleep well and stays up a lot at night and then sleeps in a lot til noon.  She says she feels like she is depressed.  FBS 100-160'S MG/DL. Bedtime BS 200-250's.  She is willing to go back and see her therapist to get some help in refocusing on herself instead of her grown sons.  A1C was 7.7%. Going to get blood work done today for Dr. Fransico Him Sees him next week.  Lab Results  Component Value Date   HGBA1C 7.7 (A) 01/01/2020   CMP Latest Ref Rng & Units 12/20/2019 05/16/2019 03/21/2014  Glucose 65 - 99 mg/dL 882(C) - 003(K)  BUN 7 - 25 mg/dL 21 21 11   Creatinine 0.50 - 0.99 mg/dL ) 1.1 9.17(H  Sodium 135 - 146 mmol/L 140 - 141  Potassium 3.5 - 5.3 mmol/L 5.9(H) - 4.3  Chloride 98 - 110 mmol/L 105 - 104  CO2 20 - 32 mmol/L 27 - 27  Calcium 8.6 - 10.4 mg/dL 10.5(H) - 9.9  Total Protein 6.1 - 8.1 g/dL 6.9 - -  Total Bilirubin 0.2 - 1.2 mg/dL 0.7 - -  Alkaline Phos 39 - 117 U/L - - -  AST 10 - 35 U/L 20 - -  ALT 6 - 29 U/L 23 - -     Preferred Learning Style:  No preference indicated   Learning Readiness:   Ready  Change in progress   MEDICATIONS:    DIETARY INTAKE:   24-hr recall:  Eats randomly.   Usual physical activity: ADL   Estimated energy needs: 1200 calories 135 g carbohydrates 90 g protein 33 g fat  Progress Towards Goal(s):  In progress.   Nutritional Diagnosis:  NB-1.1 Food and nutrition-related knowledge deficit As related to Diabetes Type 2.  As evidenced by  A1C 7%.    Intervention:  Nutrition and Diabetes education provided on My Plate, CHO counting, meal planning, portion sizes, timing of meals, avoiding snacks between meals unless having a low blood sugar, target ranges for A1C and blood sugars, signs/symptoms and treatment of hyper/hypoglycemia, monitoring blood sugars, taking medications as prescribed, benefits of exercising 30 minutes per day and prevention of complications of DM.   Goal  Eat meals on time B) 6-8  L)12-2 and D) 5-7 Make appointment with your therapist. Don't skip meals. Try to get back into Alanon, Goals FBS 80-130 and Bedtime: 100-150. Drink 100 oz of water per day Plan meals and shopping list.   Teaching Method Utilized:  Visual Auditory Hands on  Handouts given during visit include:  The Plate Method   Meal Plan Card  Diabetes Instructions.    Barriers to learning/adherence to lifestyle change: none  Demonstrated degree of understanding via:  Teach Back   Monitoring/Evaluation:  Dietary intake, exercise, , and body weight in 3 month(s).

## 2020-04-04 LAB — COMPREHENSIVE METABOLIC PANEL
ALT: 27 IU/L (ref 0–32)
AST: 23 IU/L (ref 0–40)
Albumin/Globulin Ratio: 1.6 (ref 1.2–2.2)
Albumin: 4.5 g/dL (ref 3.8–4.8)
Alkaline Phosphatase: 154 IU/L — ABNORMAL HIGH (ref 44–121)
BUN/Creatinine Ratio: 14 (ref 12–28)
BUN: 18 mg/dL (ref 8–27)
Bilirubin Total: 0.6 mg/dL (ref 0.0–1.2)
CO2: 21 mmol/L (ref 20–29)
Calcium: 10.6 mg/dL — ABNORMAL HIGH (ref 8.7–10.3)
Chloride: 101 mmol/L (ref 96–106)
Creatinine, Ser: 1.3 mg/dL — ABNORMAL HIGH (ref 0.57–1.00)
Globulin, Total: 2.9 g/dL (ref 1.5–4.5)
Glucose: 171 mg/dL — ABNORMAL HIGH (ref 65–99)
Potassium: 5.4 mmol/L — ABNORMAL HIGH (ref 3.5–5.2)
Sodium: 141 mmol/L (ref 134–144)
Total Protein: 7.4 g/dL (ref 6.0–8.5)
eGFR: 45 mL/min/{1.73_m2} — ABNORMAL LOW (ref >59)

## 2020-04-08 ENCOUNTER — Encounter: Payer: Self-pay | Admitting: Nutrition

## 2020-04-09 ENCOUNTER — Ambulatory Visit (INDEPENDENT_AMBULATORY_CARE_PROVIDER_SITE_OTHER): Payer: Medicare Other | Admitting: "Endocrinology

## 2020-04-09 ENCOUNTER — Other Ambulatory Visit: Payer: Self-pay

## 2020-04-09 ENCOUNTER — Encounter: Payer: Self-pay | Admitting: "Endocrinology

## 2020-04-09 VITALS — BP 123/74 | HR 76 | Ht 63.0 in | Wt 165.0 lb

## 2020-04-09 DIAGNOSIS — E1122 Type 2 diabetes mellitus with diabetic chronic kidney disease: Secondary | ICD-10-CM

## 2020-04-09 DIAGNOSIS — Z794 Long term (current) use of insulin: Secondary | ICD-10-CM

## 2020-04-09 DIAGNOSIS — E782 Mixed hyperlipidemia: Secondary | ICD-10-CM | POA: Diagnosis not present

## 2020-04-09 DIAGNOSIS — I10 Essential (primary) hypertension: Secondary | ICD-10-CM

## 2020-04-09 DIAGNOSIS — N182 Chronic kidney disease, stage 2 (mild): Secondary | ICD-10-CM | POA: Diagnosis not present

## 2020-04-09 LAB — POCT GLYCOSYLATED HEMOGLOBIN (HGB A1C): HbA1c, POC (controlled diabetic range): 7.7 % — AB (ref 0.0–7.0)

## 2020-04-09 MED ORDER — LANTUS SOLOSTAR 100 UNIT/ML ~~LOC~~ SOPN: 45.0000 [IU] | PEN_INJECTOR | Freq: Every day | SUBCUTANEOUS | 3 refills | Status: DC

## 2020-04-09 NOTE — Progress Notes (Signed)
04/09/2020, 4:53 PM  Endocrinology follow-up note   Subjective:    Patient ID: Sheryl Parker, female    DOB: 23-Feb-1950.  Sheryl Parker is being seen in follow-up after she was seen in consultation for management of currently uncontrolled symptomatic diabetes requested by  Ralene Ok, MD.   Past Medical History:  Diagnosis Date  . Arthritis   . Attention deficit    Dr. Misty Stanley [place]  . Chronic back pain    hx chronic narcotics- Dr in [place] recent  meds since 2 yrs ago  . Complication of anesthesia   . Disc degeneration   . Diverticulitis   . Fibromyalgia   . Hypertension   . IBS (irritable bowel syndrome)   . Migraine   . Osteoporosis   . PONV (postoperative nausea and vomiting)   . Raynaud disease     Past Surgical History:  Procedure Laterality Date  . I & D EXTREMITY Left 03/23/2014   Procedure: IRRIGATION AND DEBRIDEMENT LEFT FINGER;  Surgeon: Bradly Bienenstock, MD;  Location: [facility];  Service: Orthopedics;  Laterality: Left;  . OOPHORECTOMY  2002   right  . TUBAL LIGATION      Social History   Socioeconomic History  . Marital status: Married    Spouse name: Not on file  . Number of children: 1  . Years of education: Not on file  . Highest education level: Not on file  Occupational History  . Occupation: Surveyor, mining  Tobacco Use  . Smoking status: Former Smoker    Packs/day: 0.20    Years: 8.00    Pack years: 1.60    Types: Cigarettes    Quit date: 01/05/2018    Years since quitting: 2.2  . Smokeless tobacco: Never Used  . Tobacco comment: 1-3 cigarettes  Substance and Sexual Activity  . Alcohol use: No    Comment: HEAVY ETOH X 25 YRS, 8 YRS AGO  . Drug use: No  . Sexual activity: Not on file  Other Topics Concern  . Not on file  Social History Narrative   1 SON-Hep C, polysubstance abuse   Social Determinants of Health    Financial Resource Strain: Not on file  Food Insecurity: Not on file  Transportation Needs: Not on file  Physical Activity: Not on file  Stress: Not on file  Social Connections: Not on file    Family History  Problem Relation Age of Onset  . Hypertension Mother   . Hyperlipidemia Mother   . Hypertension Father   . Hyperlipidemia Father   . Heart attack Father   . Colon polyps Brother   . Cirrhosis Brother        etoh  . Breast cancer Sister   . Liver disease Son        Hepatitis C    Outpatient Encounter Medications as of 04/09/2020  Medication Sig  . ALPRAZolam (XANAX) 1 MG tablet Take 1 tablet by mouth 3 (three) times daily as needed.  Marland Kitchen amphetamine-dextroamphetamine (ADDERALL) 20 MG tablet Take 20 mg by mouth 2 (two) times daily.  . APPLE CIDER VINEGAR PO Take  1 tablet by mouth daily.  . ARIPiprazole (ABILIFY) 5 MG tablet Take 2.5-5 mg by mouth daily.  Marland Kitchen. buPROPion (WELLBUTRIN XL) 300 MG 24 hr tablet Take 300 mg by mouth daily.  . [facility] CINNAMON PO Take 2,000 mg by mouth daily.  Marland Kitchen. diltiazem (CARDIZEM CD) 120 MG 24 hr capsule Take 120 mg by mouth daily.  . insulin glargine (LANTUS SOLOSTAR) 100 UNIT/ML Solostar Pen Inject 45 Units into the skin at bedtime.  . metoprolol succinate (TOPROL-XL) 25 MG 24 hr tablet Take 25 mg by mouth daily.  . Multiple Vitamin (MULITIVITAMIN WITH MINERALS) TABS Take 1 tablet by mouth daily.  . pioglitazone (ACTOS) 15 MG tablet Take 15 mg by mouth daily.  . rizatriptan (MAXALT) 10 MG tablet Take 10 mg by mouth as needed. May repeat in 2 hours if needed for migraines.  . rosuvastatin (CRESTOR) 10 MG tablet Take 10 mg by mouth daily.  Marland Kitchen. telmisartan (MICARDIS) 80 MG tablet Take 80 mg by mouth daily.  . temazepam (RESTORIL) 22.5 MG capsule Take 1 capsule by mouth at bedtime.  . vitamin C (ASCORBIC ACID) 250 MG tablet Take 250 mg by mouth daily.  . [DISCONTINUED] LANTUS SOLOSTAR 100 UNIT/ML Solostar Pen INJECT 35 UNITS INTO THE SKIN AT BEDTIME   No  facility-administered encounter medications on file as of 04/09/2020.    ALLERGIES: Allergies  Allergen Reactions  . Penicillins Anaphylaxis  . Sulfa Drugs Cross Reactors Anaphylaxis  . Codeine Nausea And Vomiting  . Levofloxacin Other (See Comments)    Severe sweating and dhydration   . Prednisone Other (See Comments)    Visual disturbances and migraines     VACCINATION STATUS: Immunization History  Administered Date(s) Administered  . Influenza,inj,Quad PF,6+ Mos 10/11/2017, 10/12/2018    Diabetes She presents for her follow-up diabetic visit. She has type 2 diabetes mellitus. Onset time: She was diagnosed at approximate age of 70 years. Her disease course has been stable. There are no hypoglycemic associated symptoms. Pertinent negatives for hypoglycemia include no confusion, headaches, pallor or seizures. Associated symptoms include fatigue. Pertinent negatives for diabetes include no chest pain, no polydipsia, no polyphagia and no polyuria. There are no hypoglycemic complications. Symptoms are stable. There are no diabetic complications. Risk factors for coronary artery disease include diabetes mellitus, dyslipidemia, hypertension and post-menopausal. Current diabetic treatments: She is currently on Lantus 28 units nightly,, and pioglitazone 50 mg daily. Her weight is stable. She is following a generally unhealthy diet. When asked about meal planning, she reported none. She participates in exercise intermittently. Her home blood glucose trend is increasing steadily. Her breakfast blood glucose range is generally 140-180 mg/dl. Her bedtime blood glucose range is generally 140-180 mg/dl. Her overall blood glucose range is 140-180 mg/dl. (She presents with average blood glucose of 169 over the last 30 days, 172 over the last 14 days, 167 over the last 7 days.  Her point-of-care A1c 7.7%, unchanged from her last time A1c.  She did not document any hypoglycemia. ) An ACE inhibitor/angiotensin II  receptor blocker is being taken.  Hyperlipidemia This is a chronic problem. The current episode started more than 1 year ago. The problem is controlled. Pertinent negatives include no chest pain, myalgias or shortness of breath. Current antihyperlipidemic treatment includes statins. Risk factors for coronary artery disease include dyslipidemia, diabetes mellitus, family history, hypertension and post-menopausal.  Hypertension This is a chronic problem. The current episode started more than 1 year ago. Pertinent negatives include no chest pain, headaches, palpitations or  shortness of breath. Risk factors for coronary artery disease include diabetes mellitus, dyslipidemia and post-menopausal state. Past treatments include angiotensin blockers.     Review of Systems  Constitutional: Positive for fatigue. Negative for chills, fever and unexpected weight change.  HENT: Negative for trouble swallowing and voice change.   Eyes: Negative for visual disturbance.  Respiratory: Negative for cough, shortness of breath and wheezing.   Cardiovascular: Negative for chest pain, palpitations and leg swelling.  Gastrointestinal: Negative for diarrhea, nausea and vomiting.  Endocrine: Negative for cold intolerance, heat intolerance, polydipsia, polyphagia and polyuria.  Musculoskeletal: Negative for arthralgias and myalgias.  Skin: Negative for color change, pallor, rash and wound.  Neurological: Negative for seizures and headaches.  Psychiatric/Behavioral: Negative for confusion and suicidal ideas.    Objective:    Vitals with BMI 04/09/2020 01/01/2020 12/25/2019  Height 5\' 3"  5\' 3"  5\' 3"   Weight 165 lbs 168 lbs 168 lbs  BMI 29.24 29.77 29.77  Systolic 123 140 -  Diastolic 74 84 -  Pulse 76 63 -  Some encounter information is confidential and restricted. Go to Review Flowsheets activity to see all data.    BP 123/74   Pulse 76   Ht 5\' 3"  (1.6 m)   Wt 165 lb (74.8 kg)   BMI 29.23 kg/m   Wt  Readings from Last 3 Encounters:  04/09/20 165 lb (74.8 kg)  01/01/20 168 lb (76.2 kg)  12/25/19 168 lb (76.2 kg)     Physical Exam Constitutional:      Appearance: She is well-developed.  HENT:     Head: Normocephalic and atraumatic.  Neck:     Thyroid: No thyromegaly.     Trachea: No tracheal deviation.  Cardiovascular:     Rate and Rhythm: Normal rate.  Pulmonary:     Effort: Pulmonary effort is normal.  Abdominal:     Tenderness: There is no abdominal tenderness. There is no guarding.  Musculoskeletal:        General: Normal range of motion.     Cervical back: Normal range of motion and neck supple.  Skin:    General: Skin is warm and dry.     Coloration: Skin is not pale.     Findings: No erythema or rash.  Neurological:     Mental Status: She is alert and oriented to person, place, and time.     Cranial Nerves: No cranial nerve deficit.     Coordination: Coordination normal.     Deep Tendon Reflexes: Reflexes are normal and symmetric.  Psychiatric:        Judgment: Judgment normal.     CMP ( most recent) CMP     Component Value Date/Time   NA 141 04/03/2020 1537   K 5.4 (H) 04/03/2020 1537   CL 101 04/03/2020 1537   CO2 21 04/03/2020 1537   GLUCOSE 171 (H) 04/03/2020 1537   GLUCOSE 180 (H) 12/20/2019 1113   BUN 18 04/03/2020 1537   CREATININE 1.30 (H) 04/03/2020 1537   CREATININE 1.62 (H) 12/20/2019 1113   CALCIUM 10.6 (H) 04/03/2020 1537   PROT 7.4 04/03/2020 1537   ALBUMIN 4.5 04/03/2020 1537   AST 23 04/03/2020 1537   ALT 27 04/03/2020 1537   ALKPHOS 154 (H) 04/03/2020 1537   BILITOT 0.6 04/03/2020 1537   GFRNONAA 32 (L) 12/20/2019 1113   GFRAA 37 (L) 12/20/2019 1113     Diabetic Labs (most recent): Lab Results  Component Value Date   HGBA1C 7.7 (A) 04/09/2020  HGBA1C 7.7 (A) 01/01/2020   HGBA1C 7.0 (A) 08/28/2019     Lipid Panel ( most recent) Lipid Panel     Component Value Date/Time   CHOL 144 05/16/2019 0000   TRIG 101  05/16/2019 0000   HDL 52 05/16/2019 0000   LDLCALC 72 05/16/2019 0000      Lab Results  Component Value Date   TSH 3.19 12/20/2019   TSH 1.25 03/16/2011   FREET4 0.9 12/20/2019       Assessment & Plan:   1. Uncontrolled type 2 diabetes mellitus with hyperglycemia (HCC)  - Sheryl Parker has currently uncontrolled symptomatic type 2 DM since  70 years of age.  She presents with average blood glucose of 169 over the last 30 days, 172 over the last 14 days, 167 over the last 7 days.  Her point-of-care A1c 7.7%, unchanged from her last time A1c.  She did not document any hypoglycemia.  - I had a long discussion with her about the progressive nature of diabetes and the pathology behind its complications. -She does not report gross complications from her diabetes, however she remains at a high risk for more acute and chronic complications which include CAD, CVA, CKD, retinopathy, and neuropathy. These are all discussed in detail with her.  - I have counseled her on diet  and weight management  by adopting a carbohydrate restricted/protein rich diet. Patient is encouraged to switch to  unprocessed or minimally processed     complex starch and increased protein intake (animal or plant source), fruits, and vegetables. -  she is advised to stick to a routine mealtimes to eat 3 meals  a day and avoid unnecessary snacks ( to snack only to correct hypoglycemia).   - she acknowledges that there is a room for improvement in her food and drink choices. - Suggestion is made for her to avoid simple carbohydrates  from her diet including Cakes, Sweet Desserts, Ice Cream, Soda (diet and regular), Sweet Tea, Candies, Chips, Cookies, Store Bought Juices, Alcohol in Excess of  1-2 drinks a day, Artificial Sweeteners,  Coffee Creamer, and "Sugar-free" Products, Lemonade. This will help patient to have more stable blood glucose profile and potentially avoid unintended weight gain.   - she will be scheduled  with Norm Salt, RDN, CDE for diabetes education.  - I have approached her with the following individualized plan to manage  her diabetes and patient agrees:   - She is advised to resume and be consistent using her Lantus, increased to 45 units nightly, associated with monitoring of blood glucose twice a day-daily before breakfast and at bedtime.    -She did not tolerate Metformin.   - she is encouraged to call clinic for blood glucose levels  less than 70 or above 150 mg per DL at fasting.  - she is advised to continue pioglitazone 15 mg p.o. daily, therapeutically suitable for patient .  - she will be considered for incretin therapy as appropriate next visit.  - Specific targets for  A1c;  LDL, HDL,  and Triglycerides were discussed with the patient.  2) Blood Pressure /Hypertension:   -Her blood pressure is controlled to target.   she is advised to continue her current medications including metoprolol 25 mg p.o. daily, telmisartan 80 mg p.o. daily at breakfast.    3) Lipids/Hyperlipidemia:   Review of her recent lipid panel showed  controlled  LDL at 71 .  she  is advised to continue Crestor 10 mg p.o.  daily at bedtime.  Side effects and precautions discussed with her.    4)  Weight/Diet:  Body mass index is 29.23 kg/m.  -      she is  a candidate for weight loss. I discussed with her the fact that loss of 5 - 10% of her  current body weight will have the most impact on her diabetes management.  Exercise, and detailed carbohydrates information provided  -  detailed on discharge instructions.  5) Chronic Care/Health Maintenance:  -she  is on ACEI/ARB and Statin medications and  is encouraged to initiate and continue to follow up with Ophthalmology, Dentist,  Podiatrist at least yearly or according to recommendations, and advised to   stay away from smoking. I have recommended yearly flu vaccine and pneumonia vaccine at least every 5 years; moderate intensity exercise for up to 150  minutes weekly; and  sleep for at least 7 hours a day. -She is not using any potassium supplements.  She is advised to cut back on potassium rich food items such as oranges, bananas.  She will need repeat CMP before her next visit.   POCT ABI Results 04/09/20  Her ABI is normal today. Right ABI: 1.12      left ABI: 1.13  Right leg systolic / diastolic: 157/75 mmHg Left leg systolic / diastolic: 158/78 mmHg  Arm systolic / diastolic: 140/84 mmHG This study will be repeated in 5 years, or sooner if needed.   - she is  advised to maintain close follow up with Ralene Ok, MD for primary care needs, as well as her other providers for optimal and coordinated care.  - Time spent on this patient care encounter:  40 min, of which > 50% was spent in  counseling and the rest reviewing her blood glucose logs , discussing her hypoglycemia and hyperglycemia episodes, reviewing her current and  previous labs / studies  ( including abstraction from other facilities) and medications  doses and developing a  long term treatment plan and documenting her care.   Please refer to Patient Instructions for Blood Glucose Monitoring and Insulin/Medications Dosing Guide"  in media tab for additional information. Please  also refer to " Patient Self Inventory" in the Media  tab for reviewed elements of pertinent patient history.  Bradd Burner participated in the discussions, expressed understanding, and voiced agreement with the above plans.  All questions were answered to her satisfaction. she is encouraged to contact clinic should she have any questions or concerns prior to her return visit. nic should she have any questions or concerns prior to her return visit.   Follow up plan: - Return in about 18 weeks (around 08/13/2020) for Bring Meter and Logs- A1c in Office.  Marquis Lunch, MD [facility] [address] [place], [place] [address] Phone: [phone]   Fax: [phone]    04/09/2020, 4:53 PM  This note was partially dictated with voice recognition software. Similar sounding words can be transcribed inadequately or may not  be corrected upon review.

## 2020-04-09 NOTE — Patient Instructions (Signed)

## 2020-04-11 ENCOUNTER — Other Ambulatory Visit: Payer: Self-pay

## 2020-04-11 ENCOUNTER — Telehealth: Payer: Self-pay | Admitting: "Endocrinology

## 2020-04-11 DIAGNOSIS — E1122 Type 2 diabetes mellitus with diabetic chronic kidney disease: Secondary | ICD-10-CM

## 2020-04-11 DIAGNOSIS — N182 Chronic kidney disease, stage 2 (mild): Secondary | ICD-10-CM

## 2020-04-11 MED ORDER — LANTUS SOLOSTAR 100 UNIT/ML ~~LOC~~ SOPN: 45.0000 [IU] | PEN_INJECTOR | Freq: Every day | SUBCUTANEOUS | 3 refills | Status: DC

## 2020-04-11 NOTE — Telephone Encounter (Signed)
Sent in

## 2020-04-11 NOTE — Telephone Encounter (Signed)
Pt called and states   insulin glargine (LANTUS SOLOSTAR) 100 UNIT/ML Solostar   [facility]

## 2020-04-24 LAB — HM DIABETES EYE EXAM

## 2020-08-15 ENCOUNTER — Ambulatory Visit: Payer: Medicare Other | Admitting: "Endocrinology

## 2020-08-15 ENCOUNTER — Ambulatory Visit: Payer: Medicare Other | Admitting: Nutrition

## 2020-08-15 ENCOUNTER — Telehealth: Payer: Self-pay | Admitting: Nutrition

## 2020-08-15 NOTE — Telephone Encounter (Signed)
TC to pt. Pt. Hung up. Called back and phone wasn't answered. Left VM on cell phone. HOme phone no vm.

## 2020-09-16 ENCOUNTER — Ambulatory Visit (INDEPENDENT_AMBULATORY_CARE_PROVIDER_SITE_OTHER): Payer: Medicare Other | Admitting: "Endocrinology

## 2020-09-16 ENCOUNTER — Other Ambulatory Visit: Payer: Self-pay

## 2020-09-16 ENCOUNTER — Encounter: Payer: Self-pay | Admitting: "Endocrinology

## 2020-09-16 VITALS — BP 108/72 | HR 68 | Ht 63.0 in | Wt 162.8 lb

## 2020-09-16 DIAGNOSIS — I10 Essential (primary) hypertension: Secondary | ICD-10-CM | POA: Diagnosis not present

## 2020-09-16 DIAGNOSIS — Z794 Long term (current) use of insulin: Secondary | ICD-10-CM

## 2020-09-16 DIAGNOSIS — E782 Mixed hyperlipidemia: Secondary | ICD-10-CM | POA: Diagnosis not present

## 2020-09-16 DIAGNOSIS — E1122 Type 2 diabetes mellitus with diabetic chronic kidney disease: Secondary | ICD-10-CM

## 2020-09-16 DIAGNOSIS — N182 Chronic kidney disease, stage 2 (mild): Secondary | ICD-10-CM | POA: Diagnosis not present

## 2020-09-16 LAB — POCT GLYCOSYLATED HEMOGLOBIN (HGB A1C): HbA1c, POC (controlled diabetic range): 7.2 % — AB (ref 0.0–7.0)

## 2020-09-16 MED ORDER — FREESTYLE LIBRE 2 SENSOR MISC: 1.0000 | 3 refills | Status: DC

## 2020-09-16 MED ORDER — FREESTYLE LIBRE 2 READER DEVI: 0 refills | Status: DC

## 2020-09-16 NOTE — Progress Notes (Signed)
09/16/2020, 12:38 PM  Endocrinology follow-up note   Subjective:    Patient ID: Sheryl Parker, female    DOB: [birth year].  Sheryl Parker is being seen in follow-up after she was seen in consultation for management of currently uncontrolled symptomatic diabetes requested by  Ralene Ok, MD.   Past Medical History:  Diagnosis Date  . Arthritis   . Attention deficit    Dr. Misty Stanley [place]  . Chronic back pain    hx chronic narcotics- Dr in [place] recent  meds since 2 yrs ago  . Complication of anesthesia   . Disc degeneration   . Diverticulitis   . Fibromyalgia   . Hypertension   . IBS (irritable bowel syndrome)   . Migraine   . Osteoporosis   . PONV (postoperative nausea and vomiting)   . Raynaud disease     Past Surgical History:  Procedure Laterality Date  . I & D EXTREMITY Left 03/23/2014   Procedure: IRRIGATION AND DEBRIDEMENT LEFT FINGER;  Surgeon: Bradly Bienenstock, MD;  Location: [facility];  Service: Orthopedics;  Laterality: Left;  . OOPHORECTOMY  2002   right  . TUBAL LIGATION      Social History   Socioeconomic History  . Marital status: Married    Spouse name: Not on file  . Number of children: 1  . Years of education: Not on file  . Highest education level: Not on file  Occupational History  . Occupation: Surveyor, mining  Tobacco Use  . Smoking status: Former    Packs/day: 0.20    Years: 8.00    Pack years: 1.60    Types: Cigarettes    Quit date: 01/05/2018    Years since quitting: 2.6  . Smokeless tobacco: Never  . Tobacco comments:    1-3 cigarettes  Substance and Sexual Activity  . Alcohol use: No    Comment: HEAVY ETOH X 25 YRS, 8 YRS AGO  . Drug use: No  . Sexual activity: Not on file  Other Topics Concern  . Not on file  Social History Narrative   1 SON-Hep C, polysubstance abuse   Social Determinants of Health    Financial Resource Strain: Not on file  Food Insecurity: Not on file  Transportation Needs: Not on file  Physical Activity: Not on file  Stress: Not on file  Social Connections: Not on file    Family History  Problem Relation Age of Onset  . Hypertension Mother   . Hyperlipidemia Mother   . Hypertension Father   . Hyperlipidemia Father   . Heart attack Father   . Colon polyps Brother   . Cirrhosis Brother        etoh  . Breast cancer Sister   . Liver disease Son        Hepatitis C    Outpatient Encounter Medications as of 09/16/2020  Medication Sig  . Continuous Blood Gluc Receiver (FREESTYLE LIBRE 2 READER) DEVI As directed  . Continuous Blood Gluc Sensor (FREESTYLE LIBRE 2 SENSOR) MISC 1 Piece by Does not apply route every 14 (fourteen) days.  . ALPRAZolam Prudy Feeler) 1  MG tablet Take 1 tablet by mouth 3 (three) times daily as needed.  Marland Kitchen. amphetamine-dextroamphetamine (ADDERALL) 20 MG tablet Take 20 mg by mouth 2 (two) times daily.  . APPLE CIDER VINEGAR PO Take 1 tablet by mouth daily.  . ARIPiprazole (ABILIFY) 5 MG tablet Take 2.5-5 mg by mouth daily.  Marland Kitchen. buPROPion (WELLBUTRIN XL) 300 MG 24 hr tablet Take 300 mg by mouth daily.  . [facility] CINNAMON PO Take 2,000 mg by mouth daily. (Patient not taking: Reported on 09/16/2020)  . diltiazem (CARDIZEM CD) 120 MG 24 hr capsule Take 120 mg by mouth daily.  . insulin glargine (LANTUS SOLOSTAR) 100 UNIT/ML Solostar Pen Inject 45 Units into the skin at bedtime.  . metoprolol succinate (TOPROL-XL) 25 MG 24 hr tablet Take 25 mg by mouth daily.  . Multiple Vitamin (MULITIVITAMIN WITH MINERALS) TABS Take 1 tablet by mouth daily.  . rizatriptan (MAXALT) 10 MG tablet Take 10 mg by mouth as needed. May repeat in 2 hours if needed for migraines.  . rosuvastatin (CRESTOR) 10 MG tablet Take 10 mg by mouth daily.  Marland Kitchen. telmisartan (MICARDIS) 80 MG tablet Take 80 mg by mouth daily.  . temazepam (RESTORIL) 22.5 MG capsule Take 1 capsule by mouth at  bedtime.  . vitamin C (ASCORBIC ACID) 250 MG tablet Take 250 mg by mouth daily. (Patient not taking: Reported on 09/16/2020)  . [DISCONTINUED] pioglitazone (ACTOS) 15 MG tablet Take 15 mg by mouth daily. (Patient not taking: Reported on 09/16/2020)   No facility-administered encounter medications on file as of 09/16/2020.    ALLERGIES: Allergies  Allergen Reactions  . Penicillins Anaphylaxis  . Sulfa Drugs Cross Reactors Anaphylaxis  . Codeine Nausea And Vomiting  . Levofloxacin Other (See Comments)    Severe sweating and dhydration   . Prednisone Other (See Comments)    Visual disturbances and migraines     VACCINATION STATUS: Immunization History  Administered Date(s) Administered  . Influenza,inj,Quad PF,6+ Mos 10/11/2017, 10/12/2018    Diabetes She presents for her follow-up diabetic visit. She has type 2 diabetes mellitus. Onset time: She was diagnosed at approximate age of 70 years. Her disease course has been improving. There are no hypoglycemic associated symptoms. Pertinent negatives for hypoglycemia include no confusion, headaches, pallor or seizures. Pertinent negatives for diabetes include no chest pain, no fatigue, no polydipsia, no polyphagia and no polyuria. There are no hypoglycemic complications. Symptoms are improving. There are no diabetic complications. Risk factors for coronary artery disease include diabetes mellitus, dyslipidemia, hypertension and post-menopausal. Current diabetic treatments: She is currently on Lantus 28 units nightly,, and pioglitazone 50 mg daily. Her weight is stable. She is following a generally unhealthy diet. When asked about meal planning, she reported none. She participates in exercise intermittently. Her home blood glucose trend is decreasing steadily. Her breakfast blood glucose range is generally 140-180 mg/dl. Her bedtime blood glucose range is generally 140-180 mg/dl. Her overall blood glucose range is 140-180 mg/dl. (She presents with  continued improvement in her glycemic profile, average blood glucose of 145 and point-of-care A1c of 7.2% improving from 7.7% during her last visit.  She did not document any hypoglycemia.  She is taken off of Actos.   ) An ACE inhibitor/angiotensin II receptor blocker is being taken.  Hyperlipidemia This is a chronic problem. The current episode started more than 1 year ago. The problem is controlled. Pertinent negatives include no chest pain, myalgias or shortness of breath. Current antihyperlipidemic treatment includes statins. Risk factors for coronary artery  disease include dyslipidemia, diabetes mellitus, family history, hypertension and post-menopausal.  Hypertension This is a chronic problem. The current episode started more than 1 year ago. Pertinent negatives include no chest pain, headaches, palpitations or shortness of breath. Risk factors for coronary artery disease include diabetes mellitus, dyslipidemia and post-menopausal state. Past treatments include angiotensin blockers.    Review of Systems  Constitutional:  Negative for chills, fatigue, fever and unexpected weight change.  HENT:  Negative for trouble swallowing and voice change.   Eyes:  Negative for visual disturbance.  Respiratory:  Negative for cough, shortness of breath and wheezing.   Cardiovascular:  Negative for chest pain, palpitations and leg swelling.  Gastrointestinal:  Negative for diarrhea, nausea and vomiting.  Endocrine: Negative for cold intolerance, heat intolerance, polydipsia, polyphagia and polyuria.  Musculoskeletal:  Negative for arthralgias and myalgias.  Skin:  Negative for color change, pallor, rash and wound.  Neurological:  Negative for seizures and headaches.  Psychiatric/Behavioral:  Negative for confusion and suicidal ideas.    Objective:    Vitals with BMI 09/16/2020 04/09/2020 01/01/2020  Height 5\' 3"  5\' 3"  5\' 3"   Weight 162 lbs 13 oz 165 lbs 168 lbs  BMI 28.85 29.24 29.77  Systolic 108 123  140  Diastolic 72 74 84  Pulse 68 76 63  Some encounter information is confidential and restricted. Go to Review Flowsheets activity to see all data.    BP 108/72   Pulse 68   Ht 5\' 3"  (1.6 m)   Wt 162 lb 12.8 oz (73.8 kg)   BMI 28.84 kg/m   Wt Readings from Last 3 Encounters:  09/16/20 162 lb 12.8 oz (73.8 kg)  04/09/20 165 lb (74.8 kg)  01/01/20 168 lb (76.2 kg)     Physical Exam Constitutional:      Appearance: She is well-developed.  HENT:     Head: Normocephalic and atraumatic.  Neck:     Thyroid: No thyromegaly.     Trachea: No tracheal deviation.  Cardiovascular:     Rate and Rhythm: Normal rate.  Pulmonary:     Effort: Pulmonary effort is normal.  Abdominal:     Tenderness: There is no abdominal tenderness. There is no guarding.  Musculoskeletal:        General: Normal range of motion.     Cervical back: Normal range of motion and neck supple.  Skin:    General: Skin is warm and dry.     Coloration: Skin is not pale.     Findings: No erythema or rash.  Neurological:     Mental Status: She is alert and oriented to person, place, and time.     Cranial Nerves: No cranial nerve deficit.     Coordination: Coordination normal.     Deep Tendon Reflexes: Reflexes are normal and symmetric.  Psychiatric:        Judgment: Judgment normal.    CMP ( most recent) CMP     Component Value Date/Time   NA 141 04/03/2020 1537   K 5.4 (H) 04/03/2020 1537   CL 101 04/03/2020 1537   CO2 21 04/03/2020 1537   GLUCOSE 171 (H) 04/03/2020 1537   GLUCOSE 180 (H) 12/20/2019 1113   BUN 18 04/03/2020 1537   CREATININE 1.30 (H) 04/03/2020 1537   CREATININE 1.62 (H) 12/20/2019 1113   CALCIUM 10.6 (H) 04/03/2020 1537   PROT 7.4 04/03/2020 1537   ALBUMIN 4.5 04/03/2020 1537   AST 23 04/03/2020 1537   ALT 27 04/03/2020 1537  ALKPHOS 154 (H) 04/03/2020 1537   BILITOT 0.6 04/03/2020 1537   GFRNONAA 32 (L) 12/20/2019 1113   GFRAA 37 (L) 12/20/2019 1113     Diabetic Labs  (most recent): Lab Results  Component Value Date   HGBA1C 7.2 (A) 09/16/2020   HGBA1C 7.7 (A) 04/09/2020   HGBA1C 7.7 (A) 01/01/2020     Lipid Panel ( most recent) Lipid Panel     Component Value Date/Time   CHOL 144 05/16/2019 0000   TRIG 101 05/16/2019 0000   HDL 52 05/16/2019 0000   LDLCALC 72 05/16/2019 0000      Lab Results  Component Value Date   TSH 3.19 12/20/2019   TSH 1.25 03/16/2011   FREET4 0.9 12/20/2019       Assessment & Plan:   1. Uncontrolled type 2 diabetes mellitus with hyperglycemia (HCC)  - RHEGAN TRUNNELL has currently uncontrolled symptomatic type 2 DM since  70 years of age.  She presents with continued improvement in her glycemic profile, average blood glucose of 145 and point-of-care A1c of 7.2% improving from 7.7% during her last visit.  She did not document any hypoglycemia.  She is taken off of Actos.    - I had a long discussion with her about the progressive nature of diabetes and the pathology behind its complications. -She does not report gross complications from her diabetes, however she remains at a high risk for more acute and chronic complications which include CAD, CVA, CKD, retinopathy, and neuropathy. These are all discussed in detail with her.  - I have counseled her on diet  and weight management  by adopting a carbohydrate restricted/protein rich diet. Patient is encouraged to switch to  unprocessed or minimally processed     complex starch and increased protein intake (animal or plant source), fruits, and vegetables. -  she is advised to stick to a routine mealtimes to eat 3 meals  a day and avoid unnecessary snacks ( to snack only to correct hypoglycemia).   - she acknowledges that there is a room for improvement in her food and drink choices. - Suggestion is made for her to avoid simple carbohydrates  from her diet including Cakes, Sweet Desserts, Ice Cream, Soda (diet and regular), Sweet Tea, Candies, Chips, Cookies, Store  Bought Juices, Alcohol in Excess of  1-2 drinks a day, Artificial Sweeteners,  Coffee Creamer, and "Sugar-free" Products, Lemonade. This will help patient to have more stable blood glucose profile and potentially avoid unintended weight gain.   - she will be scheduled with Norm Salt, RDN, CDE for diabetes education.  - I have approached her with the following individualized plan to manage  her diabetes and patient agrees:   - She is advised to continue Lantus 45 units nightly, associated with monitoring of blood glucose twice a day-daily before breakfast and at bedtime.    -She did not tolerate Metformin.   - she is encouraged to call clinic for blood glucose levels  less than 70 or above 150 mg per DL at fasting.  - she was taken off of pioglitazone since last visit.     - she will be considered for incretin therapy as appropriate next visit.  - Specific targets for  A1c;  LDL, HDL,  and Triglycerides were discussed with the patient.  2) Blood Pressure /Hypertension:   -Her blood pressure is controlled to target.  she is advised to continue her current medications including metoprolol 25 mg p.o. daily, telmisartan 80 mg p.o.  daily at breakfast.    3) Lipids/Hyperlipidemia:   Review of her recent lipid panel showed  controlled  LDL at 71 .  she  is advised to continue Crestor 10 mg p.o. daily at bedtime.    Side effects and precautions discussed with her.    4)  Weight/Diet:  Body mass index is 28.84 kg/m.  -      she is  a candidate for weight loss. I discussed with her the fact that loss of 5 - 10% of her  current body weight will have the most impact on her diabetes management.  Exercise, and detailed carbohydrates information provided  -  detailed on discharge instructions.  5) Chronic Care/Health Maintenance:  -she  is on ACEI/ARB and Statin medications and  is encouraged to initiate and continue to follow up with Ophthalmology, Dentist,  Podiatrist at least yearly or according  to recommendations, and advised to   stay away from smoking. I have recommended yearly flu vaccine and pneumonia vaccine at least every 5 years; moderate intensity exercise for up to 150 minutes weekly; and  sleep for at least 7 hours a day. -She is not using any potassium supplements.  She is advised to cut back on potassium rich food items such as oranges, bananas.  She will need repeat CMP before her next visit.  Her screening ABI was negative in December 2021.  Her neck study will be due in December 2026, or sooner if needed.    - she is  advised to maintain close follow up with Ralene Ok, MD for primary care needs, as well as her other providers for optimal and coordinated care.    I spent 32 minutes in the care of the patient today including review of labs from CMP, Lipids, Thyroid Function, Hematology (current and previous including abstractions from other facilities); face-to-face time discussing  her blood glucose readings/logs, discussing hypoglycemia and hyperglycemia episodes and symptoms, medications doses, her options of short and long term treatment based on the latest standards of care / guidelines;  discussion about incorporating lifestyle medicine;  and documenting the encounter.    Please refer to Patient Instructions for Blood Glucose Monitoring and Insulin/Medications Dosing Guide"  in media tab for additional information. Please  also refer to " Patient Self Inventory" in the Media  tab for reviewed elements of pertinent patient history.  Bradd Burner participated in the discussions, expressed understanding, and voiced agreement with the above plans.  All questions were answered to her satisfaction. she is encouraged to contact clinic should she have any questions or concerns prior to her return visit.  Follow up plan: - Return in about 4 months (around 01/16/2021) for F/U with Pre-visit Labs, Meter, Logs, A1c here.Marquis Lunch, MD [facility] [address] [place], [place] [address] Phone: [phone]  Fax: [phone]    09/16/2020, 12:38 PM  This note was partially dictated with voice recognition software. Similar sounding words can be transcribed inadequately or may not  be corrected upon review.

## 2020-09-16 NOTE — Patient Instructions (Signed)

## 2020-10-11 ENCOUNTER — Telehealth: Payer: Self-pay

## 2020-10-11 NOTE — Telephone Encounter (Signed)
Left a message requesting a return call to the office. 

## 2020-10-14 ENCOUNTER — Telehealth: Payer: Self-pay

## 2020-10-14 NOTE — Telephone Encounter (Signed)
Left a message requesting a return call the office.

## 2020-10-14 NOTE — Telephone Encounter (Signed)
Patient left a VM stating that every morning between 1-3 am her blood sugar is dropping below 70. She is having to get up and eat to get it back up. Please Advise

## 2020-10-15 NOTE — Telephone Encounter (Signed)
Tried to call pt, did not receive an answer at her home telephone number and left a message requesting a return call to the office on her cell phone.

## 2020-10-16 NOTE — Telephone Encounter (Signed)
Left a message requesting a return call to the office. 

## 2020-11-11 ENCOUNTER — Telehealth: Payer: Self-pay

## 2020-11-11 NOTE — Telephone Encounter (Signed)
Left a message requesting a return call to the office. 

## 2020-11-13 ENCOUNTER — Telehealth: Payer: Self-pay | Admitting: "Endocrinology

## 2020-11-13 NOTE — Telephone Encounter (Signed)
I have not seen this fax yet. Have you seen this fax?

## 2020-11-13 NOTE — Telephone Encounter (Signed)
American with [employer] called to check the status of a fax that was sent on 11/08/20 for CGM. She said if you did receive, to please fax that back with office notes to [phone]. Her call back number is [phone]

## 2020-11-13 NOTE — Telephone Encounter (Signed)
I received the fax, will fax back today.

## 2020-11-14 NOTE — Telephone Encounter (Signed)
Left patient a VM making her aware of message below

## 2020-12-03 ENCOUNTER — Telehealth: Payer: Self-pay | Admitting: "Endocrinology

## 2020-12-03 DIAGNOSIS — E1122 Type 2 diabetes mellitus with diabetic chronic kidney disease: Secondary | ICD-10-CM

## 2020-12-03 MED ORDER — LANTUS SOLOSTAR 100 UNIT/ML ~~LOC~~ SOPN: 30.0000 [IU] | PEN_INJECTOR | Freq: Every day | SUBCUTANEOUS | 0 refills | Status: DC

## 2020-12-03 NOTE — Telephone Encounter (Signed)
Rx sent 

## 2020-12-03 NOTE — Telephone Encounter (Signed)
Patient is requesting a refill on her lantus. Please send to [facility]

## 2020-12-04 ENCOUNTER — Ambulatory Visit (INDEPENDENT_AMBULATORY_CARE_PROVIDER_SITE_OTHER): Payer: Medicare Other | Admitting: "Endocrinology

## 2020-12-04 ENCOUNTER — Other Ambulatory Visit: Payer: Self-pay

## 2020-12-04 DIAGNOSIS — Z794 Long term (current) use of insulin: Secondary | ICD-10-CM

## 2020-12-04 DIAGNOSIS — N182 Chronic kidney disease, stage 2 (mild): Secondary | ICD-10-CM

## 2020-12-04 DIAGNOSIS — E1122 Type 2 diabetes mellitus with diabetic chronic kidney disease: Secondary | ICD-10-CM

## 2020-12-04 NOTE — Progress Notes (Signed)
Discussed and demonstrated set up and use of Libre 2 reader and application of [place] 2 sensors. Pt voiced and demonstrated understanding, all pt questions answered at time of visit. Advised pt if she had any other questions to contact the our office. Understanding voiced.

## 2021-01-16 ENCOUNTER — Ambulatory Visit: Payer: Medicare Other | Admitting: "Endocrinology

## 2021-01-24 LAB — COMPREHENSIVE METABOLIC PANEL
ALT: 27 IU/L (ref 0–32)
AST: 21 IU/L (ref 0–40)
Albumin/Globulin Ratio: 1.5 (ref 1.2–2.2)
Albumin: 4.2 g/dL (ref 3.8–4.8)
Alkaline Phosphatase: 135 IU/L — ABNORMAL HIGH (ref 44–121)
BUN/Creatinine Ratio: 13 (ref 12–28)
BUN: 18 mg/dL (ref 8–27)
Bilirubin Total: 0.5 mg/dL (ref 0.0–1.2)
CO2: 21 mmol/L (ref 20–29)
Calcium: 9.8 mg/dL (ref 8.7–10.3)
Chloride: 104 mmol/L (ref 96–106)
Creatinine, Ser: 1.36 mg/dL — ABNORMAL HIGH (ref 0.57–1.00)
Globulin, Total: 2.8 g/dL (ref 1.5–4.5)
Glucose: 134 mg/dL — ABNORMAL HIGH (ref 70–99)
Potassium: 5.2 mmol/L (ref 3.5–5.2)
Sodium: 140 mmol/L (ref 134–144)
Total Protein: 7 g/dL (ref 6.0–8.5)
eGFR: 42 mL/min/{1.73_m2} — ABNORMAL LOW (ref >59)

## 2021-01-24 LAB — TSH: TSH: 2.32 u[IU]/mL (ref 0.450–4.500)

## 2021-01-24 LAB — LIPID PANEL
Chol/HDL Ratio: 3.6 ratio (ref 0.0–4.4)
Cholesterol, Total: 191 mg/dL (ref 100–199)
HDL: 53 mg/dL (ref >39)
LDL Chol Calc (NIH): 121 mg/dL — ABNORMAL HIGH (ref 0–99)
Triglycerides: 95 mg/dL (ref 0–149)
VLDL Cholesterol Cal: 17 mg/dL (ref 5–40)

## 2021-01-24 LAB — T4, FREE: Free T4: 0.98 ng/dL (ref 0.82–1.77)

## 2021-01-30 ENCOUNTER — Ambulatory Visit (INDEPENDENT_AMBULATORY_CARE_PROVIDER_SITE_OTHER): Payer: Medicare Other | Admitting: "Endocrinology

## 2021-01-30 ENCOUNTER — Other Ambulatory Visit: Payer: Self-pay

## 2021-01-30 ENCOUNTER — Encounter: Payer: Self-pay | Admitting: "Endocrinology

## 2021-01-30 VITALS — BP 122/78 | HR 80 | Ht 63.0 in | Wt 159.2 lb

## 2021-01-30 DIAGNOSIS — I10 Essential (primary) hypertension: Secondary | ICD-10-CM | POA: Diagnosis not present

## 2021-01-30 DIAGNOSIS — N182 Chronic kidney disease, stage 2 (mild): Secondary | ICD-10-CM | POA: Diagnosis not present

## 2021-01-30 DIAGNOSIS — E782 Mixed hyperlipidemia: Secondary | ICD-10-CM

## 2021-01-30 DIAGNOSIS — E1122 Type 2 diabetes mellitus with diabetic chronic kidney disease: Secondary | ICD-10-CM | POA: Diagnosis not present

## 2021-01-30 DIAGNOSIS — Z794 Long term (current) use of insulin: Secondary | ICD-10-CM

## 2021-01-30 LAB — POCT GLYCOSYLATED HEMOGLOBIN (HGB A1C): HbA1c, POC (controlled diabetic range): 7.1 % — AB (ref 0.0–7.0)

## 2021-01-30 MED ORDER — ROSUVASTATIN CALCIUM 20 MG PO TABS: 20.0000 mg | ORAL_TABLET | Freq: Every day | ORAL | 1 refills | Status: DC

## 2021-01-30 NOTE — Progress Notes (Signed)
01/31/2021, 7:07 AM  Endocrinology follow-up note   Subjective:    Patient ID: Sheryl Parker, female    DOB: 1950/10/20.  Sheryl Parker is being seen in follow-up after she was seen in consultation for management of currently uncontrolled symptomatic diabetes requested by  Jilda Panda, MD.   Past Medical History:  Diagnosis Date   Arthritis    Attention deficit    Dr. Lattie Haw [place]   Chronic back pain    hx chronic narcotics- Dr in [place] recent  meds since 2 yrs ago   Complication of anesthesia    Disc degeneration    Diverticulitis    Fibromyalgia    Hypertension    IBS (irritable bowel syndrome)    Migraine    Osteoporosis    PONV (postoperative nausea and vomiting)    Raynaud disease     Past Surgical History:  Procedure Laterality Date   I & D EXTREMITY Left 03/23/2014   Procedure: IRRIGATION AND DEBRIDEMENT LEFT FINGER;  Surgeon: Iran Planas, MD;  Location: [facility];  Service: Orthopedics;  Laterality: Left;   OOPHORECTOMY  2002   right   TUBAL LIGATION      Social History   Socioeconomic History   Marital status: Married    Spouse name: Not on file   Number of children: 1   Years of education: Not on file   Highest education level: Not on file  Occupational History   Occupation: DISABLED-[employer]  Tobacco Use   Smoking status: Former    Packs/day: 0.20    Years: 8.00    Pack years: 1.60    Types: Cigarettes    Quit date: 01/05/2018    Years since quitting: 3.0   Smokeless tobacco: Never   Tobacco comments:    1-3 cigarettes  Substance and Sexual Activity   Alcohol use: No    Comment: HEAVY ETOH X 25 YRS, 8 YRS AGO   Drug use: No   Sexual activity: Not on file  Other Topics Concern   Not on file  Social History Narrative   1 SON-Hep C, polysubstance abuse   Social Determinants of Health   Financial Resource Strain: Not on  file  Food Insecurity: Not on file  Transportation Needs: Not on file  Physical Activity: Not on file  Stress: Not on file  Social Connections: Not on file    Family History  Problem Relation Age of Onset   Hypertension Mother    Hyperlipidemia Mother    Hypertension Father    Hyperlipidemia Father    Heart attack Father    Colon polyps Brother    Cirrhosis Brother        etoh   Breast cancer Sister    Liver disease Son        Hepatitis C    Outpatient Encounter Medications as of 01/30/2021  Medication Sig   ALPRAZolam (XANAX) 1 MG tablet Take 1 tablet by mouth 3 (three) times daily as needed.   amphetamine-dextroamphetamine (ADDERALL) 20 MG tablet Take 20 mg by mouth 2 (two) times daily.   APPLE CIDER VINEGAR PO  Take 1 tablet by mouth daily.   ARIPiprazole (ABILIFY) 5 MG tablet Take 2.5-5 mg by mouth daily.   buPROPion (WELLBUTRIN XL) 300 MG 24 hr tablet Take 300 mg by mouth daily.   Continuous Blood Gluc Receiver (FREESTYLE LIBRE 2 READER) DEVI As directed   Continuous Blood Gluc Sensor (FREESTYLE LIBRE 2 SENSOR) MISC 1 Piece by Does not apply route every 14 (fourteen) days.   diltiazem (CARDIZEM CD) 120 MG 24 hr capsule Take 120 mg by mouth daily.   insulin glargine (LANTUS SOLOSTAR) 100 UNIT/ML Solostar Pen Inject 30 Units into the skin at bedtime.   metoprolol succinate (TOPROL-XL) 25 MG 24 hr tablet Take 25 mg by mouth daily.   Multiple Vitamin (MULITIVITAMIN WITH MINERALS) TABS Take 1 tablet by mouth daily.   rizatriptan (MAXALT) 10 MG tablet Take 10 mg by mouth as needed. May repeat in 2 hours if needed for migraines.   rosuvastatin (CRESTOR) 20 MG tablet Take 1 tablet (20 mg total) by mouth daily.   telmisartan (MICARDIS) 80 MG tablet Take 80 mg by mouth daily.   temazepam (RESTORIL) 22.5 MG capsule Take 1 capsule by mouth at bedtime.   [DISCONTINUED] [facility] CINNAMON PO Take 2,000 mg by mouth daily. (Patient not taking: Reported on 09/16/2020)   [DISCONTINUED]  rosuvastatin (CRESTOR) 10 MG tablet Take 10 mg by mouth daily.   [DISCONTINUED] vitamin C (ASCORBIC ACID) 250 MG tablet Take 250 mg by mouth daily. (Patient not taking: Reported on 09/16/2020)   No facility-administered encounter medications on file as of 01/30/2021.    ALLERGIES: Allergies  Allergen Reactions   Penicillins Anaphylaxis   Sulfa Drugs Cross Reactors Anaphylaxis   Codeine Nausea And Vomiting   Levofloxacin Other (See Comments)    Severe sweating and dhydration    Prednisone Other (See Comments)    Visual disturbances and migraines     VACCINATION STATUS: Immunization History  Administered Date(s) Administered   Influenza,inj,Quad PF,6+ Mos 10/11/2017, 10/12/2018    Diabetes She presents for her follow-up diabetic visit. She has type 2 diabetes mellitus. Onset time: She was diagnosed at approximate age of 21 years. Her disease course has been improving. There are no hypoglycemic associated symptoms. Pertinent negatives for hypoglycemia include no confusion, headaches, pallor or seizures. Pertinent negatives for diabetes include no chest pain, no fatigue, no polydipsia, no polyphagia and no polyuria. There are no hypoglycemic complications. Symptoms are improving. There are no diabetic complications. Risk factors for coronary artery disease include diabetes mellitus, dyslipidemia, hypertension and post-menopausal. Current diabetic treatments: She is currently on Lantus 28 units nightly,, and pioglitazone 50 mg daily. Her weight is decreasing steadily. She is following a generally unhealthy diet. When asked about meal planning, she reported none. She participates in exercise intermittently. Her home blood glucose trend is decreasing steadily. Her breakfast blood glucose range is generally 140-180 mg/dl. Her bedtime blood glucose range is generally 140-180 mg/dl. Her overall blood glucose range is 140-180 mg/dl. Dishon presents with significant improvement in her glycemic profile.   Her CGM is reviewed.  AGP shows 69% time range, 23% slightly above range.  No hypoglycemia.  Her point-of-care A1c is 7.1%, generally improving.   ) An ACE inhibitor/angiotensin II receptor blocker is being taken.  Hyperlipidemia This is a chronic problem. The current episode started more than 1 year ago. The problem is controlled. Pertinent negatives include no chest pain, myalgias or shortness of breath. Current antihyperlipidemic treatment includes statins. Risk factors for coronary artery disease include dyslipidemia, diabetes mellitus,  family history, hypertension and post-menopausal.  Hypertension This is a chronic problem. The current episode started more than 1 year ago. Pertinent negatives include no chest pain, headaches, palpitations or shortness of breath. Risk factors for coronary artery disease include diabetes mellitus, dyslipidemia and post-menopausal state. Past treatments include angiotensin blockers.    Review of Systems  Constitutional:  Negative for chills, fatigue, fever and unexpected weight change.  HENT:  Negative for trouble swallowing and voice change.   Eyes:  Negative for visual disturbance.  Respiratory:  Negative for cough, shortness of breath and wheezing.   Cardiovascular:  Negative for chest pain, palpitations and leg swelling.  Gastrointestinal:  Negative for diarrhea, nausea and vomiting.  Endocrine: Negative for cold intolerance, heat intolerance, polydipsia, polyphagia and polyuria.  Musculoskeletal:  Negative for arthralgias and myalgias.  Skin:  Negative for color change, pallor, rash and wound.  Neurological:  Negative for seizures and headaches.  Psychiatric/Behavioral:  Negative for confusion and suicidal ideas.    Objective:    Vitals with BMI 01/30/2021 09/16/2020 04/09/2020  Height 5\' 3"  5\' 3"  5\' 3"   Weight 159 lbs 3 oz 162 lbs 13 oz 165 lbs  BMI 28.21 [id number]  Systolic [id number]  Diastolic 78 72 74  Pulse 80 68 76  Some encounter  information is confidential and restricted. Go to Review Flowsheets activity to see all data.    BP 122/78    Pulse 80    Ht 5\' 3"  (1.6 m)    Wt 159 lb 3.2 oz (72.2 kg)    BMI 28.20 kg/m   Wt Readings from Last 3 Encounters:  01/30/21 159 lb 3.2 oz (72.2 kg)  09/16/20 162 lb 12.8 oz (73.8 kg)  04/09/20 165 lb (74.8 kg)      CMP ( most recent) CMP     Component Value Date/Time   NA 140 01/23/2021 1104   K 5.2 01/23/2021 1104   CL 104 01/23/2021 1104   CO2 21 01/23/2021 1104   GLUCOSE 134 (H) 01/23/2021 1104   GLUCOSE 180 (H) 12/20/2019 1113   BUN 18 01/23/2021 1104   CREATININE 1.36 (H) 01/23/2021 1104   CREATININE 1.62 (H) 12/20/2019 1113   CALCIUM 9.8 01/23/2021 1104   PROT 7.0 01/23/2021 1104   ALBUMIN 4.2 01/23/2021 1104   AST 21 01/23/2021 1104   ALT 27 01/23/2021 1104   ALKPHOS 135 (H) 01/23/2021 1104   BILITOT 0.5 01/23/2021 1104   GFRNONAA 32 (L) 12/20/2019 1113   GFRAA 37 (L) 12/20/2019 1113     Diabetic Labs (most recent): Lab Results  Component Value Date   HGBA1C 7.1 (A) 01/30/2021   HGBA1C 7.2 (A) 09/16/2020   HGBA1C 7.7 (A) 04/09/2020     Lipid Panel ( most recent) Lipid Panel     Component Value Date/Time   CHOL 191 01/23/2021 1104   TRIG 95 01/23/2021 1104   HDL 53 01/23/2021 1104   CHOLHDL 3.6 01/23/2021 1104   LDLCALC 121 (H) 01/23/2021 1104   LABVLDL 17 01/23/2021 1104      Lab Results  Component Value Date   TSH 2.320 01/23/2021   TSH 3.19 12/20/2019   TSH 1.25 03/16/2011   FREET4 0.98 01/23/2021   FREET4 0.9 12/20/2019       Assessment & Plan:   1. Uncontrolled type 2 diabetes mellitus with hyperglycemia (HCC)  - BRAILEY LUPOLI has currently uncontrolled symptomatic type 2 DM since  71 years of age.  Chairty presents with significant  improvement in her glycemic profile.  Her CGM is reviewed.  AGP shows 69% time range, 23% slightly above range.  No hypoglycemia.  Her point-of-care A1c is 7.1%, generally improving.    Patient presents with great satisfaction, losing 10 pounds since last year.  - I had a long discussion with her about the progressive nature of diabetes and the pathology behind its complications. -She does not report gross complications from her diabetes, however she remains at a high risk for more acute and chronic complications which include CAD, CVA, CKD, retinopathy, and neuropathy. These are all discussed in detail with her.  - I have counseled her on diet  and weight management  by adopting a carbohydrate restricted/protein rich diet. Patient is encouraged to switch to  unprocessed or minimally processed     complex starch and increased protein intake (animal or plant source), fruits, and vegetables. -  she is advised to stick to a routine mealtimes to eat 3 meals  a day and avoid unnecessary snacks ( to snack only to correct hypoglycemia).   - she acknowledges that there is a room for improvement in her food and drink choices. - Suggestion is made for her to avoid simple carbohydrates  from her diet including Cakes, Sweet Desserts, Ice Cream, Soda (diet and regular), Sweet Tea, Candies, Chips, Cookies, Store Bought Juices, Alcohol , Artificial Sweeteners,  Coffee Creamer, and "Sugar-free" Products, Lemonade. This will help patient to have more stable blood glucose profile and potentially avoid unintended weight gain.  The following Lifestyle Medicine recommendations according to [place]  [facility]) were discussed and and offered to patient and she  agrees to start the journey:  A. Whole Foods, Plant-Based Nutrition comprising of fruits and vegetables, plant-based proteins, whole-grain carbohydrates was discussed in detail with the patient.   A list for source of those nutrients were also provided to the patient.  Patient will use only water or unsweetened tea for hydration. B.  The need to stay away from risky substances including alcohol, smoking; obtaining 7 to 9  hours of restorative sleep, at least 150 minutes of moderate intensity exercise weekly, the importance of healthy social connections,  and stress management techniques were discussed. C.  A full color page of  Calorie density of various food groups per pound showing examples of each food groups was provided to the patient.    - she will be scheduled with Jearld Fenton, RDN, CDE for diabetes education.  - I have approached her with the following individualized plan to manage  her diabetes and patient agrees:   - She is benefiting and achieving more with de-escalation of medication.  She is advised to continue Lantus 45 units nightly as her only medication for treating diabetes at this time.  She is taken off of Actos.  She does not tolerate metformin.    She is encouraged to use her CGM at all times.   - she is encouraged to call clinic for blood glucose levels  less than 70 or above 150 mg per DL at fasting.    - she will be considered for incretin therapy as if necessary, appropriate next visit.  - Specific targets for  A1c;  LDL, HDL,  and Triglycerides were discussed with the patient.  2) Blood Pressure /Hypertension:   -Her blood pressure is controlled to target  . she is advised to continue her current medications including metoprolol 25 mg p.o. daily, telmisartan 80 mg p.o. daily at breakfast.  3) Lipids/Hyperlipidemia:   Review of her recent lipid panel showed  controlled  LDL at 71 .  she  is advised to continue Crestor 10 mg p.o. daily at bedtime.     Side effects and precautions discussed with her.    4)  Weight/Diet:  Body mass index is 28.2 kg/m.  -      she is  a candidate for weight loss. I discussed with her the fact that loss of 5 - 10% of her  current body weight will have the most impact on her diabetes management.  Exercise, and detailed carbohydrates information provided  -  detailed on discharge instructions.  5) Chronic Care/Health Maintenance:  -she  is on  ACEI/ARB and Statin medications and  is encouraged to initiate and continue to follow up with Ophthalmology, Dentist,  Podiatrist at least yearly or according to recommendations, and advised to   stay away from smoking. I have recommended yearly flu vaccine and pneumonia vaccine at least every 5 years; moderate intensity exercise for up to 150 minutes weekly; and  sleep for at least 7 hours a day. -She is not using any potassium supplements.  She is advised to cut back on potassium rich food items such as oranges, bananas.  She will need repeat CMP before her next visit.  Her screening ABI was negative in December 2021.  Her neck study will be due in December 2026, or sooner if needed.    - she is  advised to maintain close follow up with Jilda Panda, MD for primary care needs, as well as her other providers for optimal and coordinated care.    I spent 42 minutes in the care of the patient today including review of labs from [place], Lipids, Thyroid Function, Hematology (current and previous including abstractions from other facilities); face-to-face time discussing  her blood glucose readings/logs, discussing hypoglycemia and hyperglycemia episodes and symptoms, medications doses, her options of short and long term treatment based on the latest standards of care / guidelines;  discussion about incorporating lifestyle medicine;  and documenting the encounter.    Please refer to Patient Instructions for Blood Glucose Monitoring and Insulin/Medications Dosing Guide"  in media tab for additional information. Please  also refer to " Patient Self Inventory" in the Media  tab for reviewed elements of pertinent patient history.  Basilio Cairo participated in the discussions, expressed understanding, and voiced agreement with the above plans.  All questions were answered to her satisfaction. she is encouraged to contact clinic should she have any questions or concerns prior to her return visit.   Follow up  plan: - Return in about 4 months (around 05/30/2021) for Bring Meter and Logs- A1c in Office.  Glade Lloyd, MD [facility] [address] [place], [place] [address] Phone: [phone]  Fax: [phone]    01/31/2021, 7:07 AM  This note was partially dictated with voice recognition software. Similar sounding words can be transcribed inadequately or may not  be corrected upon review.

## 2021-01-30 NOTE — Patient Instructions (Signed)

## 2021-02-11 ENCOUNTER — Telehealth: Payer: Self-pay | Admitting: "Endocrinology

## 2021-02-11 DIAGNOSIS — E1122 Type 2 diabetes mellitus with diabetic chronic kidney disease: Secondary | ICD-10-CM

## 2021-02-11 NOTE — Telephone Encounter (Signed)
Left a message requesting pt return call to the office. ?

## 2021-02-11 NOTE — Telephone Encounter (Signed)
Pt is calling and would like a refill with 2 extra refills so that she has a total of a 3 month supply.  insulin glargine (LANTUS SOLOSTAR) 100 UNIT/ML Solostar Pen   [facility] - [place], [place] - [address] Phone:  [phone]  Fax:  [phone]

## 2021-02-12 NOTE — Telephone Encounter (Signed)
Left a message requesting pt return call to the office. ?

## 2021-02-13 NOTE — Telephone Encounter (Signed)
Left a message requesting pt return call to the office. ?

## 2021-02-19 MED ORDER — LANTUS SOLOSTAR 100 UNIT/ML ~~LOC~~ SOPN: 30.0000 [IU] | PEN_INJECTOR | Freq: Every day | SUBCUTANEOUS | 1 refills | Status: DC

## 2021-02-19 NOTE — Telephone Encounter (Signed)
Pt returning your call

## 2021-02-19 NOTE — Addendum Note (Signed)
Addended by: Derrell Lolling on: 02/19/2021 03:51 PM   Modules accepted: Orders

## 2021-02-19 NOTE — Telephone Encounter (Signed)
Talked with pt, confirmed her dose of Lantus as 45 units qhs. Rx for 90 day supply with 1 refill sent to [facility].

## 2021-03-28 ENCOUNTER — Ambulatory Visit (INDEPENDENT_AMBULATORY_CARE_PROVIDER_SITE_OTHER): Payer: Medicare Other | Admitting: Orthopedic Surgery

## 2021-03-28 ENCOUNTER — Ambulatory Visit: Payer: Medicare Other | Admitting: Physician Assistant

## 2021-03-28 ENCOUNTER — Other Ambulatory Visit: Payer: Self-pay

## 2021-03-28 ENCOUNTER — Encounter: Payer: Self-pay | Admitting: Orthopedic Surgery

## 2021-03-28 VITALS — Ht 63.0 in | Wt 159.0 lb

## 2021-03-28 DIAGNOSIS — S92035A Nondisplaced avulsion fracture of tuberosity of left calcaneus, initial encounter for closed fracture: Secondary | ICD-10-CM

## 2021-03-28 NOTE — Progress Notes (Signed)
New Patient Visit ? ?Assessment: ?Sheryl Parker is a 71 y.o. female with the following: ?1. Closed nondisplaced avulsion fracture of tuberosity of left calcaneus, initial encounter ? ?Plan: ?Reviewed radiographs in clinic today which demonstrates a minimally displaced avulsion fracture off the anterior lateral aspect of the left calcaneus.  She was fitted for a walking boot.  She can progress to weightbearing as tolerated.  I provided her with ankle exercises to initiate as soon as she is able.  Continue to ice, elevate and take medications as needed.  Follow-up for repeat evaluation in 2 weeks. ? ? ?Follow-up: ?Return in about 2 weeks (around 04/11/2021). ? ?Subjective: ? ?Chief Complaint  ?Patient presents with  ? Ankle Pain  ?  Lt ankle DOI 03/22/21  ? ? ?History of Present Illness: ?Sheryl Parker is a 71 y.o. female who presents for evaluation of left ankle pain.  She states she rolled her ankle, almost 2 weeks ago.  Initially, she tried to deal with the pain.  She had a lot of swelling and bruising.  Since then, the swelling has improved, but she continues to have significant discomfort.  She is wearing a regular shoe, but this is uncomfortable.  She was seen in urgent care center earlier this week, and referred for orthopedic follow-up.  She has been taking over-the-counter medications as needed. ? ? ?Review of Systems: ?No fevers or chills ?No numbness or tingling ?No chest pain ?No shortness of breath ?No bowel or bladder dysfunction ?No GI distress ?No headaches ? ? ?Medical History: ? ?Past Medical History:  ?Diagnosis Date  ? Arthritis   ? Attention deficit   ? Dr. Misty Stanley [place]  ? Chronic back pain   ? hx chronic narcotics- Dr in [place] recent  meds since 2 yrs ago  ? Complication of anesthesia   ? Disc degeneration   ? Diverticulitis   ? Fibromyalgia   ? Hypertension   ? IBS (irritable bowel syndrome)   ? Migraine   ? Osteoporosis   ? PONV (postoperative nausea and vomiting)   ?  Raynaud disease   ? ? ?Past Surgical History:  ?Procedure Laterality Date  ? I & D EXTREMITY Left 03/23/2014  ? Procedure: IRRIGATION AND DEBRIDEMENT LEFT FINGER;  Surgeon: Bradly Bienenstock, MD;  Location: [facility];  Service: Orthopedics;  Laterality: Left;  ? OOPHORECTOMY  2002  ? right  ? TUBAL LIGATION    ? ? ?Family History  ?Problem Relation Age of Onset  ? Hypertension Mother   ? Hyperlipidemia Mother   ? Hypertension Father   ? Hyperlipidemia Father   ? Heart attack Father   ? Colon polyps Brother   ? Cirrhosis Brother   ?     etoh  ? Breast cancer Sister   ? Liver disease Son   ?     Hepatitis C  ? ?Social History  ? ?Tobacco Use  ? Smoking status: Former  ?  Packs/day: 0.20  ?  Years: 8.00  ?  Pack years: 1.60  ?  Types: Cigarettes  ?  Quit date: 01/05/2018  ?  Years since quitting: 3.2  ? Smokeless tobacco: Never  ? Tobacco comments:  ?  1-3 cigarettes  ?Substance Use Topics  ? Alcohol use: No  ?  Comment: HEAVY ETOH X 25 YRS, 8 YRS AGO  ? Drug use: No  ? ? ?Allergies  ?Allergen Reactions  ? Penicillins Anaphylaxis  ? Sulfa Drugs Cross Reactors Anaphylaxis  ? Codeine Nausea And  Vomiting  ? Levofloxacin Other (See Comments)  ?  Severe sweating and dhydration   ? Prednisone Other (See Comments)  ?  Visual disturbances and migraines   ? ? ?No outpatient medications have been marked as taking for the 03/28/21 encounter (Office Visit) with Oliver Barre, MD.  ? ? ?Objective: ?Ht 5\' 3"  (1.6 m)   Wt 159 lb (72.1 kg)   BMI 28.17 kg/m?  ? ?Physical Exam: ? ?General: Alert and oriented. and No acute distress. ?Gait: Unable to ambulate. ? ?Evaluation of the left ankle demonstrates no obvious bruising.  Mild diffuse swelling throughout the ankle and the foot.  Tenderness to palpation over the anterior lateral aspect of the ankle.  Toes are warm and well-perfused.  DP pulse.  Active motion intact EHL/TA. ? ?IMAGING: ?I personally reviewed images previously obtained from the ED ? ?X-ray of the left ankle demonstrates small,  avulsion fracture of the anterior lateral aspect of the calcaneus.  No additional injuries are noted.  No dislocations. ? ?New Medications:  ?No orders of the defined types were placed in this encounter. ? ? ? ? ? , MD ? ?03/28/2021 ?11:25 PM ? ? ?

## 2021-03-28 NOTE — Patient Instructions (Signed)
Tylenol, ice, elevate, walking boot, weight bearing as tolerated.  Consider voltaren gel, available over the counter.  ? ? ? ?Instructions ? ?1.  You have sustained an ankle sprain, or similar exercises that can be treated as an ankle sprain.  **These exercises can also be used as part of recovery from an ankle fracture.  ?2.  I encourage you to stay on your feet and gradually remove your walking boot.   ?3.  Below are some exercises that you can complete on your own to improve your symptoms.  ?4.  As an alternative, you can search for ankle sprain exercises online, and can see some demonstrations on YouTube  ?5.  If you are having difficulty with these exercises, we can also prescribe formal physical therapy ? ?Ankle Exercises ?Ask your health care provider which exercises are safe for you. Do exercises exactly as told by your health care provider and adjust them as directed. It is normal to feel mild stretching, pulling, tightness, or mild discomfort as you do these exercises. Stop right away if you feel sudden pain or your pain gets worse. Do not begin these exercises until told by your health care provider. ? ?Stretching and range-of-motion exercises ?These exercises warm up your muscles and joints and improve the movement and flexibility of your ankle. These exercises may also help to relieve pain. ? ?Dorsiflexion/plantar flexion ? ?Sit with your L knee straight or bent. Do not rest your foot on anything. ?Flex your left ankle to tilt the top of your foot toward your shin. This is called dorsiflexion. ?Hold this position for 5 seconds. ?Point your toes downward to tilt the top of your foot away from your shin. This is called plantar flexion. ?Hold this position for 5 seconds. ?Repeat 10 times. Complete this exercise 2-3 times a day.  As tolerated ? ?Ankle alphabet ? ?Sit with your L foot supported at your lower leg. ?Do not rest your foot on anything. ?Make sure your foot has room to move freely. ?Think of your  L foot as a paintbrush: ?Move your foot to trace each letter of the alphabet in the air. Keep your hip and knee still while you trace the letters. Trace every letter from A to Z. ?Make the letters as large as you can without causing or increasing any discomfort. ? ?Repeat 2-3 times. Complete this exercise 2-3 times a day. ? ? ?Strengthening exercises ?These exercises build strength and endurance in your ankle. Endurance is the ability to use your muscles for a long time, even after they get tired. ?Dorsiflexors ?These are muscles that lift your foot up. ?Secure a rubber exercise band or tube to an object, such as a table leg, that will stay still when the band is pulled. Secure the other end around your L foot. ?Sit on the floor, facing the object with your L leg extended. The band or tube should be slightly tense when your foot is relaxed. ?Slowly flex your L ankle and toes to bring your foot toward your shin. ?Hold this position for 5 seconds. ?Slowly return your foot to the starting position, controlling the band as you do that. ?Repeat 10 times. Complete this exercise 2-3 times a day. ? ?Plantar flexors ?These are muscles that push your foot down. ?Sit on the floor with your L leg extended. ?Loop a rubber exercise band or tube around the ball of your L foot. The ball of your foot is on the walking surface, right under your toes. The  band or tube should be slightly tense when your foot is relaxed. ?Slowly point your toes downward, pushing them away from you. ?Hold this position for 5 seconds. ?Slowly release the tension in the band or tube, controlling smoothly until your foot is back in the starting position. ?Repeat 10 times. Complete this exercise 2-3 times a day. ? ?Towel curls ? ?Sit in a chair on a non-carpeted surface, and put your feet on the floor. ?Place a towel in front of your feet. ?Keeping your heel on the floor, put your L foot on the towel. ?Pull the towel toward you by grabbing the towel with  your toes and curling them under. Keep your heel on the floor. ?Let your toes relax. ?Grab the towel again. Keep pulling the towel until it is completely underneath your foot. ?Repeat 10 times. Complete this exercise 2-3 times a day. ? ?Standing plantar flexion ?This is an exercise in which you use your toes to lift your body's weight while standing. ?Stand with your feet shoulder-width apart. ?Keep your weight spread evenly over the width of your feet while you rise up on your toes. Use a wall or table to steady yourself if needed, but try not to use it for support. ?If this exercise is too easy, try these options: ?Shift your weight toward your L leg until you feel challenged. ?If told by your health care provider, lift your uninjured leg off the floor. ?Hold this position for 5 seconds. ?Repeat 10 times. Complete this exercise 2-3 times a day. ? ?Tandem walking ?Stand with one foot directly in front of the other. ?Slowly raise your back foot up, lifting your heel before your toes, and place it directly in front of your other foot. ?Continue to walk in this heel-to-toe way. Have a countertop or wall nearby to use if needed to keep your balance, but try not to hold onto anything for support. ? ?Repeat 10 times. Complete this exercise 2-3 times a day. ? ?

## 2021-04-09 ENCOUNTER — Ambulatory Visit: Payer: Medicare Other

## 2021-04-09 ENCOUNTER — Ambulatory Visit (INDEPENDENT_AMBULATORY_CARE_PROVIDER_SITE_OTHER): Payer: Medicare Other | Admitting: Orthopedic Surgery

## 2021-04-09 ENCOUNTER — Encounter: Payer: Self-pay | Admitting: Orthopedic Surgery

## 2021-04-09 VITALS — Ht 63.0 in | Wt 159.0 lb

## 2021-04-09 DIAGNOSIS — S92035D Nondisplaced avulsion fracture of tuberosity of left calcaneus, subsequent encounter for fracture with routine healing: Secondary | ICD-10-CM

## 2021-04-09 NOTE — Progress Notes (Signed)
Return patient Visit ? ?Assessment: ?Sheryl Parker is a 71 y.o. female with the following: ?1. Closed nondisplaced avulsion fracture of tuberosity of left calcaneus, subsequent encounter ? ?Plan: ?Small avulsion fracture off the anterior lateral aspect of the calcaneus.  Her pain, swelling and function have improved.  She is walking, without an assistive device currently, in a walking boot.  I have recommended she continue to use the boot for an additional 2 weeks.  After that, she can start to wean out of the boot.  We will plan to see her back in 4 weeks for repeat evaluation.  Continue with medications as needed.  Continue with the exercises that I previously provided her. ? ? ?Follow-up: ?Return in about 4 weeks (around 05/07/2021). ? ?Subjective: ? ?Chief Complaint  ?Patient presents with  ? Fracture  ?  Lt ankle DOI 03/22/21  ? ? ?History of Present Illness: ?Sheryl Parker is a 71 y.o. female who presents for evaluation of left ankle pain.  She states she rolled her ankle, almost 4 weeks ago.  Since she was last seen in clinic, she notes improvement in the pain, swelling and mobility of her left ankle.  She is able to ambulate in the walking boot.  Occasionally, she will remove the boot, and the tenderness and pain is worse.  No recent injuries.  She is taking some medications as needed. ? ?Review of Systems: ?No fevers or chills ?No numbness or tingling ?No chest pain ?No shortness of breath ?No bowel or bladder dysfunction ?No GI distress ?No headaches ? ? ? ?Objective: ?Ht 5\' 3"  (1.6 m)   Wt 159 lb (72.1 kg)   BMI 28.17 kg/m?  ? ?Physical Exam: ? ?General: Alert and oriented. and No acute distress. ?Gait: Unable to ambulate. ? ?Left ankle without deformity.  No bruising.  No swelling.  She tolerates dorsiflexion, plantarflexion, inversion and eversion.  Continued tenderness over the anterior lateral ankle.  No bruising this area.  Toes warm and well-perfused. ? ?IMAGING: ?I personally reviewed images  previously obtained from the ED ? ?X-ray of the left ankle was obtained in clinic today.  These were compared to prior x-rays.  No acute injuries are noted.  Prior small avulsion fracture is not easily identified.  Mortise is congruent.  No syndesmotic disruption. ? ?Impression: Healing left avulsion fracture from the anterior lateral calcaneus. ? ? ?New Medications:  ?No orders of the defined types were placed in this encounter. ? ? ? ? ? , MD ? ?04/09/2021 ?11:03 PM ? ? ?

## 2021-04-09 NOTE — Patient Instructions (Signed)
Walking boot for 2 more weeks.  After that, you can start to wean out of the boot.  Work on range of motion of the ankle.  ?

## 2021-04-10 IMAGING — US US RENAL
1 series · 14 of 25 positions shown · non-contrast
Comparison: None.

CLINICAL DATA: Chronic kidney disease.  Stage III

EXAM:
RENAL / URINARY TRACT ULTRASOUND COMPLETE

[Series 1: us renal · 0.23mm/px · 14 of 44 slices shown]
[im 1/44]
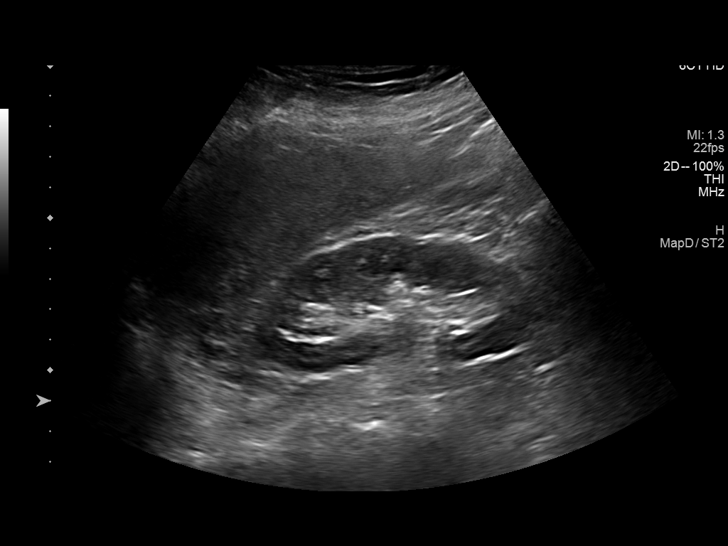
[im 4/44]
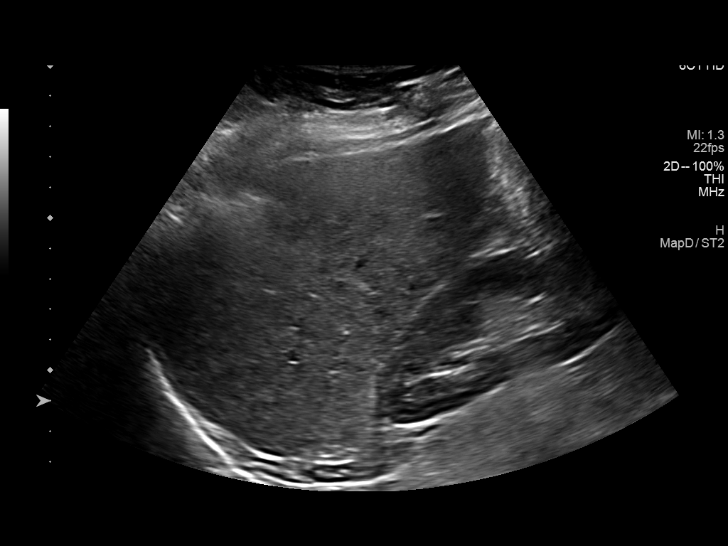
[im 8/44]
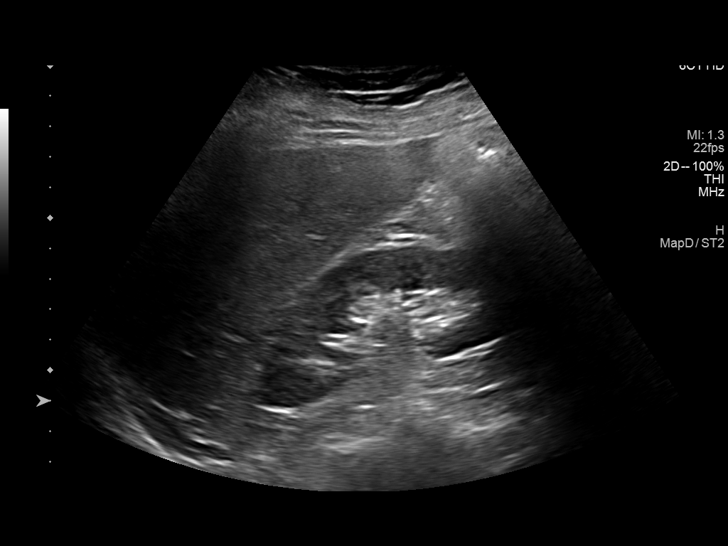
[im 11/44]
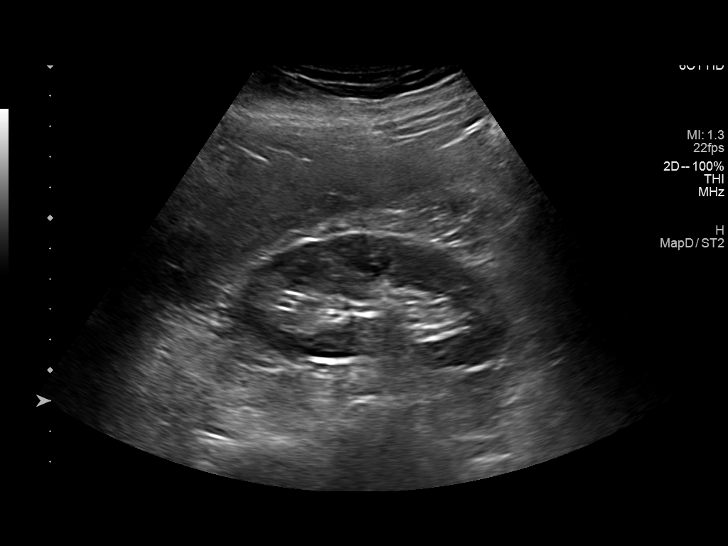
[im 15/44]
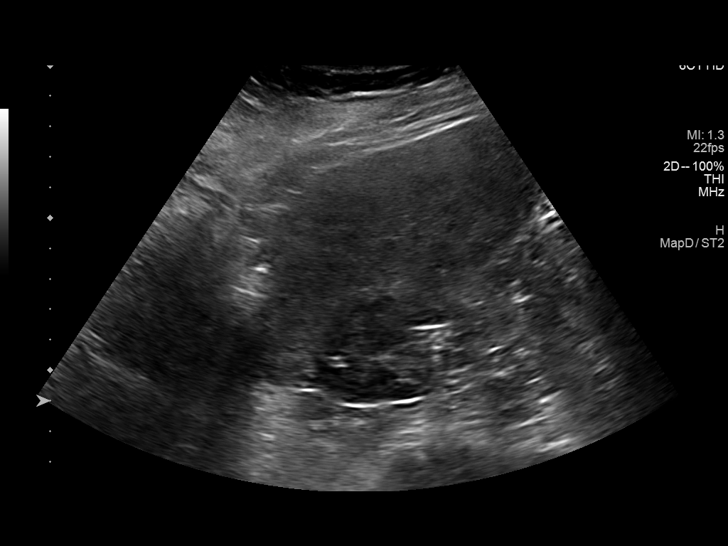
[im 17/44]
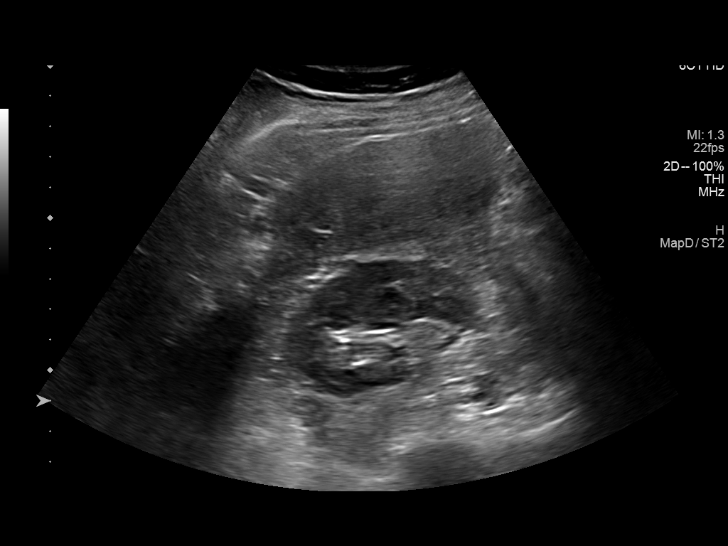
[im 20/44]
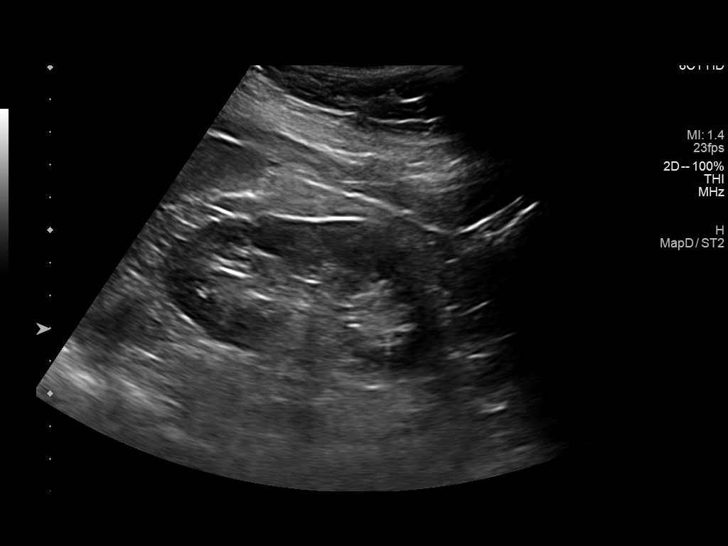
[im 24/44]
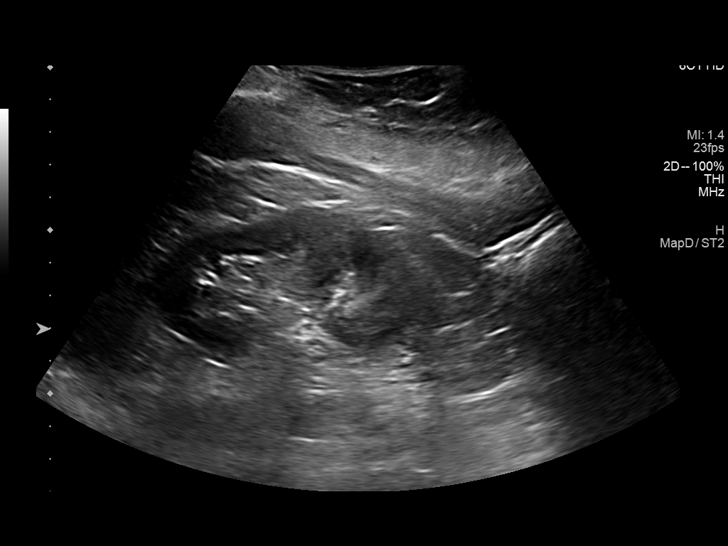
[im 27/44]
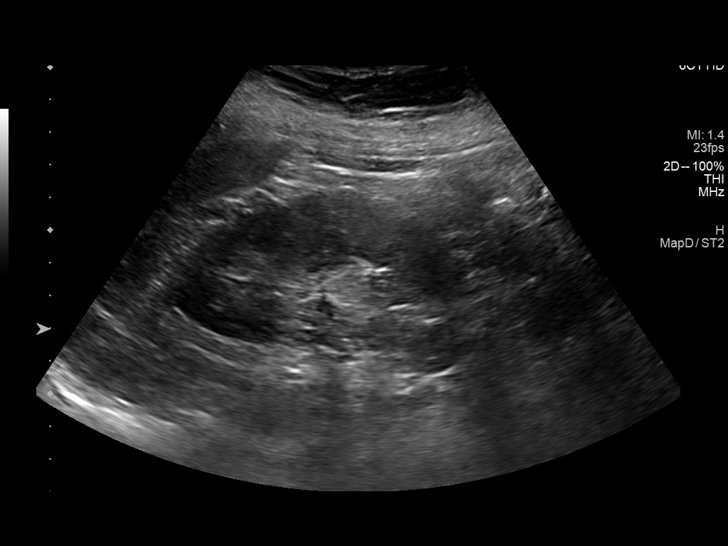
[im 29/44]
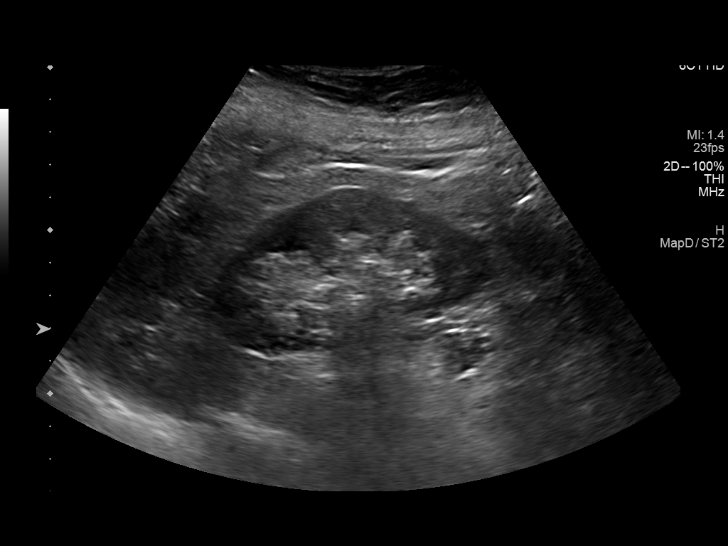
[im 33/44]
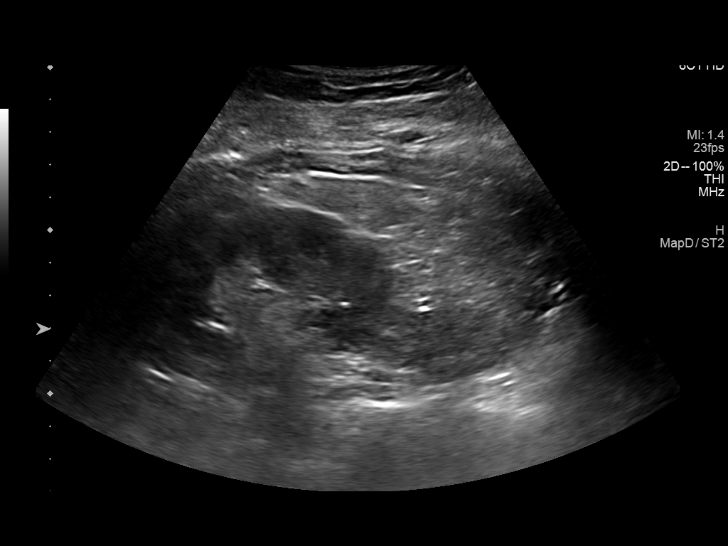
[im 36/44]
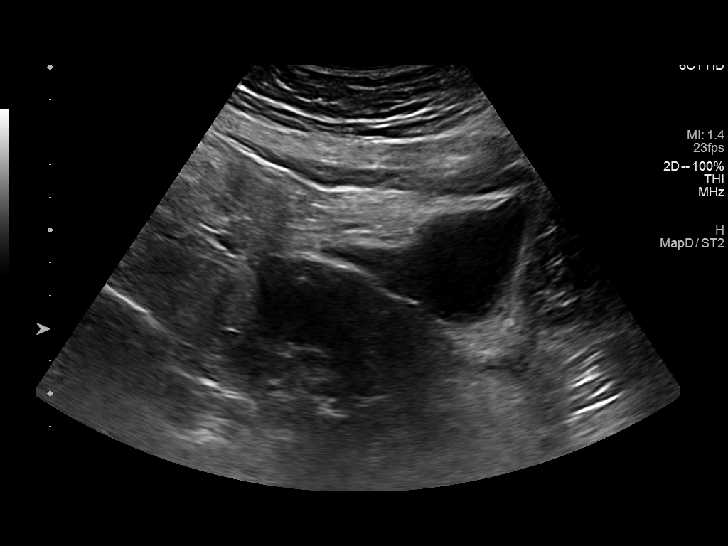
[im 40/44]
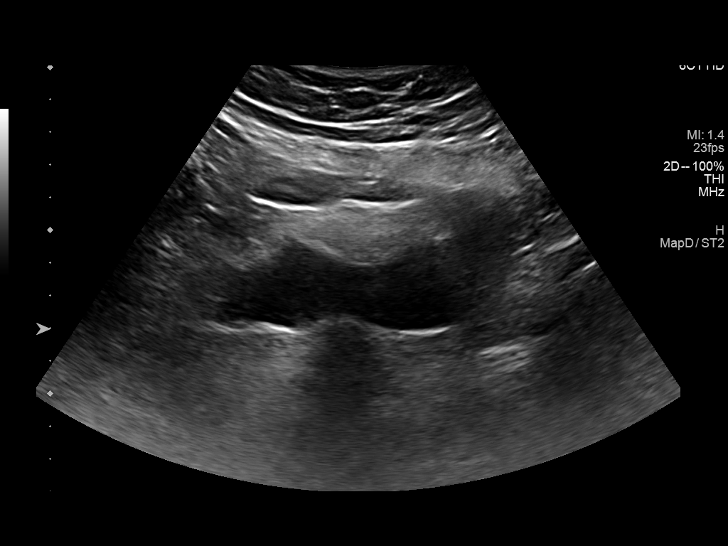
[im 44/44]
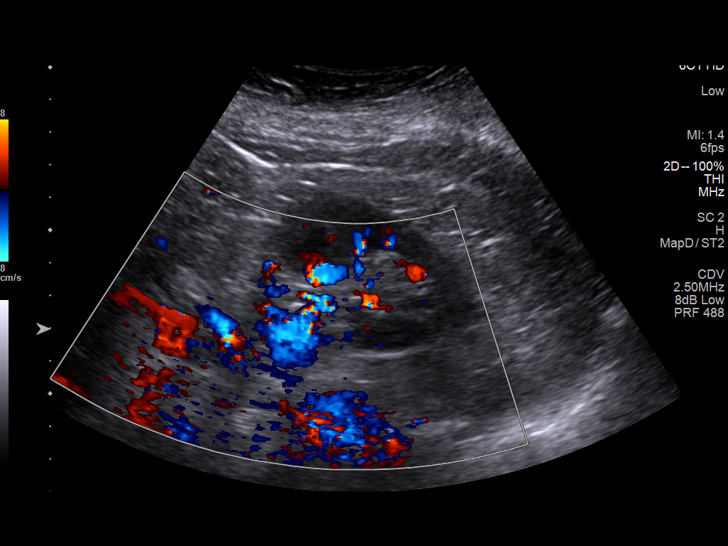

[14 of 25 positions shown; findings below may reference images not displayed]

FINDINGS: Right Kidney:

Renal measurements: 9.5 x 4.2 x 5.1 cm = volume: 104 mL.
Echogenicity within normal limits. No mass or hydronephrosis
visualized.

Left Kidney:

Renal measurements: 9.2 x 4.4 x 5.9 cm = volume: 124 mL.
Echogenicity within normal limits. No mass or hydronephrosis
visualized.

Bladder:

Appears normal for degree of bladder distention.

Other:

None.
IMPRESSION: Negative bilateral renal ultrasound.

## 2021-05-07 ENCOUNTER — Encounter: Payer: Self-pay | Admitting: Orthopedic Surgery

## 2021-05-07 ENCOUNTER — Ambulatory Visit (INDEPENDENT_AMBULATORY_CARE_PROVIDER_SITE_OTHER): Payer: Medicare Other | Admitting: Orthopedic Surgery

## 2021-05-07 DIAGNOSIS — S92035D Nondisplaced avulsion fracture of tuberosity of left calcaneus, subsequent encounter for fracture with routine healing: Secondary | ICD-10-CM

## 2021-05-07 NOTE — Progress Notes (Signed)
Return patient Visit ? ?Assessment: ?TORUNN CHANCELLOR is a 71 y.o. female with the following: ?1. Closed nondisplaced avulsion fracture of tuberosity of left calcaneus, subsequent encounter ? ?Plan: ?She is doing very well.  She is pleased with her progress.  Continue with exercises.  Occasional swelling is to be expected.  Return precautions discussed.  Follow up as needed.  ? ? ?Follow-up: ?No follow-ups on file. ? ?Subjective: ? ?Chief Complaint  ?Patient presents with  ? Post-op Follow-up  ?  Left DOI 03/22/21- doing good a little swollen thinks she overdone it yesterday  ? ? ?History of Present Illness: ?NADA GODLEY is a 71 y.o. female who presents for evaluation of left ankle pain.  Rolled her ankle 6 weeks ago.  Pain is much better.  Notices occasional swelling.  She takes tylenol occasionally.  She is walking in a regular shoe.  Working on exercises.  ? ?Review of Systems: ?No fevers or chills ?No numbness or tingling ?No chest pain ?No shortness of breath ?No bowel or bladder dysfunction ?No GI distress ?No headaches ? ? ? ?Objective: ?There were no vitals taken for this visit. ? ?Physical Exam: ? ?General: Alert and oriented. and No acute distress. ?Gait: Unable to ambulate. ? ?Left ankle without deformity.  No bruising.  No swelling.  She tolerates dorsiflexion, plantarflexion, inversion and eversion.  No tenderness.   Toes warm and well-perfused.  Heel cord tight, but she can get to a plantigrade position.  ? ?IMAGING: ?I personally reviewed images previously obtained from the ED ? ?No new imaging obtained today ? ? ?New Medications:  ?No orders of the defined types were placed in this encounter. ? ? ? ? ?Oliver Barre, MD ? ?05/07/2021 ?4:08 PM ? ? ?

## 2021-06-04 ENCOUNTER — Encounter: Payer: Self-pay | Admitting: "Endocrinology

## 2021-06-04 ENCOUNTER — Ambulatory Visit (INDEPENDENT_AMBULATORY_CARE_PROVIDER_SITE_OTHER): Payer: Medicare Other | Admitting: "Endocrinology

## 2021-06-04 VITALS — BP 108/82 | HR 84 | Ht 63.0 in | Wt 163.0 lb

## 2021-06-04 DIAGNOSIS — N182 Chronic kidney disease, stage 2 (mild): Secondary | ICD-10-CM | POA: Diagnosis not present

## 2021-06-04 DIAGNOSIS — I10 Essential (primary) hypertension: Secondary | ICD-10-CM | POA: Diagnosis not present

## 2021-06-04 DIAGNOSIS — E1122 Type 2 diabetes mellitus with diabetic chronic kidney disease: Secondary | ICD-10-CM

## 2021-06-04 DIAGNOSIS — E782 Mixed hyperlipidemia: Secondary | ICD-10-CM

## 2021-06-04 DIAGNOSIS — Z794 Long term (current) use of insulin: Secondary | ICD-10-CM | POA: Diagnosis not present

## 2021-06-04 LAB — POCT GLYCOSYLATED HEMOGLOBIN (HGB A1C): HbA1c, POC (controlled diabetic range): 7.3 % — AB (ref 0.0–7.0)

## 2021-06-04 NOTE — Progress Notes (Signed)
06/04/2021, 2:22 PM  Endocrinology follow-up note   Subjective:    Patient ID: Sheryl Parker, female    DOB: [birth year].  SACRED ROA is being seen in follow-up after she was seen in consultation for management of currently uncontrolled symptomatic diabetes requested by  Ralene Ok, MD.   Past Medical History:  Diagnosis Date   Arthritis    Attention deficit    Dr. Misty Stanley [place]   Chronic back pain    hx chronic narcotics- Dr in [place] recent  meds since 2 yrs ago   Complication of anesthesia    Disc degeneration    Diverticulitis    Fibromyalgia    Hypertension    IBS (irritable bowel syndrome)    Migraine    Osteoporosis    PONV (postoperative nausea and vomiting)    Raynaud disease     Past Surgical History:  Procedure Laterality Date   I & D EXTREMITY Left 03/23/2014   Procedure: IRRIGATION AND DEBRIDEMENT LEFT FINGER;  Surgeon: Bradly Bienenstock, MD;  Location: [facility];  Service: Orthopedics;  Laterality: Left;   OOPHORECTOMY  2002   right   TUBAL LIGATION      Social History   Socioeconomic History   Marital status: Married    Spouse name: Not on file   Number of children: 1   Years of education: Not on file   Highest education level: Not on file  Occupational History   Occupation: DISABLED-[employer]  Tobacco Use   Smoking status: Former    Packs/day: 0.20    Years: 8.00    Pack years: 1.60    Types: Cigarettes    Quit date: 01/05/2018    Years since quitting: 3.4   Smokeless tobacco: Never   Tobacco comments:    1-3 cigarettes  Substance and Sexual Activity   Alcohol use: No    Comment: HEAVY ETOH X 25 YRS, 8 YRS AGO   Drug use: No   Sexual activity: Not on file  Other Topics Concern   Not on file  Social History Narrative   1 SON-Hep C, polysubstance abuse   Social Determinants of Health   Financial Resource Strain: Not on  file  Food Insecurity: Not on file  Transportation Needs: Not on file  Physical Activity: Not on file  Stress: Not on file  Social Connections: Not on file    Family History  Problem Relation Age of Onset   Hypertension Mother    Hyperlipidemia Mother    Hypertension Father    Hyperlipidemia Father    Heart attack Father    Colon polyps Brother    Cirrhosis Brother        etoh   Breast cancer Sister    Liver disease Son        Hepatitis C    Outpatient Encounter Medications as of 06/04/2021  Medication Sig   ALPRAZolam (XANAX) 1 MG tablet Take 1 tablet by mouth 3 (three) times daily as needed.   amphetamine-dextroamphetamine (ADDERALL) 20 MG tablet Take 20 mg by mouth 2 (two) times daily. (Patient not taking: Reported on 06/04/2021)  APPLE CIDER VINEGAR PO Take 1 tablet by mouth daily.   ARIPiprazole (ABILIFY) 5 MG tablet Take 2.5-5 mg by mouth daily.   buPROPion (WELLBUTRIN XL) 300 MG 24 hr tablet Take 300 mg by mouth daily.   Continuous Blood Gluc Receiver (FREESTYLE LIBRE 2 READER) DEVI As directed   Continuous Blood Gluc Sensor (FREESTYLE LIBRE 2 SENSOR) MISC 1 Piece by Does not apply route every 14 (fourteen) days.   diltiazem (CARDIZEM CD) 120 MG 24 hr capsule Take 120 mg by mouth daily.   insulin glargine (LANTUS SOLOSTAR) 100 UNIT/ML Solostar Pen Inject 30 Units into the skin at bedtime.   metoprolol succinate (TOPROL-XL) 25 MG 24 hr tablet Take 25 mg by mouth daily.   Multiple Vitamin (MULITIVITAMIN WITH MINERALS) TABS Take 1 tablet by mouth daily.   rizatriptan (MAXALT) 10 MG tablet Take 10 mg by mouth as needed. May repeat in 2 hours if needed for migraines.   rosuvastatin (CRESTOR) 20 MG tablet Take 1 tablet (20 mg total) by mouth daily.   telmisartan (MICARDIS) 80 MG tablet Take 80 mg by mouth daily.   temazepam (RESTORIL) 22.5 MG capsule Take 1 capsule by mouth at bedtime.   VYVANSE 70 MG capsule Take 70 mg by mouth every morning.   No facility-administered  encounter medications on file as of 06/04/2021.    ALLERGIES: Allergies  Allergen Reactions   Penicillins Anaphylaxis   Sulfa Drugs Cross Reactors Anaphylaxis   Codeine Nausea And Vomiting   Levofloxacin Other (See Comments)    Severe sweating and dhydration    Prednisone Other (See Comments)    Visual disturbances and migraines     VACCINATION STATUS: Immunization History  Administered Date(s) Administered   Influenza,inj,Quad PF,6+ Mos 10/11/2017, 10/12/2018    Diabetes She presents for her follow-up diabetic visit. She has type 2 diabetes mellitus. Onset time: She was diagnosed at approximate age of 24 years. Her disease course has been stable. There are no hypoglycemic associated symptoms. Pertinent negatives for hypoglycemia include no confusion, headaches, pallor or seizures. Pertinent negatives for diabetes include no chest pain, no fatigue, no polydipsia, no polyphagia and no polyuria. There are no hypoglycemic complications. Symptoms are improving. There are no diabetic complications. Risk factors for coronary artery disease include diabetes mellitus, dyslipidemia, hypertension and post-menopausal. Current diabetic treatments: She is currently on Lantus 28 units nightly,, and pioglitazone 50 mg daily. Her weight is decreasing steadily. She is following a generally unhealthy diet. When asked about meal planning, she reported none. She participates in exercise intermittently. Her home blood glucose trend is decreasing steadily. Her breakfast blood glucose range is generally 140-180 mg/dl. Her bedtime blood glucose range is generally 140-180 mg/dl. Her overall blood glucose range is 140-180 mg/dl. Horgan presents with her CGM device.  Her most recent 2 weeks average blood glucose is 155 mg per DL.  Her AGP report shows 73% hemorrhage, 22% level 1 hyperglycemia, 6% level 2 hyperglycemia.  No hypoglycemia.  Her point-of-care A1c is 7.3%, unchanged from her last visit A1c.    ) An ACE  inhibitor/angiotensin II receptor blocker is being taken.  Hyperlipidemia This is a chronic problem. The current episode started more than 1 year ago. The problem is controlled. Pertinent negatives include no chest pain, myalgias or shortness of breath. Current antihyperlipidemic treatment includes statins. Risk factors for coronary artery disease include dyslipidemia, diabetes mellitus, family history, hypertension and post-menopausal.  Hypertension This is a chronic problem. The current episode started more than 1 year ago.  Pertinent negatives include no chest pain, headaches, palpitations or shortness of breath. Risk factors for coronary artery disease include diabetes mellitus, dyslipidemia and post-menopausal state. Past treatments include angiotensin blockers.    Review of Systems  Constitutional:  Negative for chills, fatigue, fever and unexpected weight change.  HENT:  Negative for trouble swallowing and voice change.   Eyes:  Negative for visual disturbance.  Respiratory:  Negative for cough, shortness of breath and wheezing.   Cardiovascular:  Negative for chest pain, palpitations and leg swelling.  Gastrointestinal:  Negative for diarrhea, nausea and vomiting.  Endocrine: Negative for cold intolerance, heat intolerance, polydipsia, polyphagia and polyuria.  Musculoskeletal:  Negative for arthralgias and myalgias.  Skin:  Negative for color change, pallor, rash and wound.  Neurological:  Negative for seizures and headaches.  Psychiatric/Behavioral:  Negative for confusion and suicidal ideas.    Objective:       06/04/2021    1:36 PM 04/09/2021    1:32 PM 03/28/2021   10:30 AM  Vitals with BMI  Height 5\' 3"  5\' 3"  5\' 3"   Weight 163 lbs 159 lbs 159 lbs  BMI 28.88 28.17 28.17  Systolic 108    Diastolic 82    Pulse 84      BP 108/82   Pulse 84   Ht 5\' 3"  (1.6 m)   Wt 163 lb (73.9 kg)   BMI 28.87 kg/m   Wt Readings from Last 3 Encounters:  06/04/21 163 lb (73.9 kg)   04/09/21 159 lb (72.1 kg)  03/28/21 159 lb (72.1 kg)      CMP ( most recent) CMP     Component Value Date/Time   NA 140 01/23/2021 1104   K 5.2 01/23/2021 1104   CL 104 01/23/2021 1104   CO2 21 01/23/2021 1104   GLUCOSE 134 (H) 01/23/2021 1104   GLUCOSE 180 (H) 12/20/2019 1113   BUN 18 01/23/2021 1104   CREATININE 1.36 (H) 01/23/2021 1104   CREATININE 1.62 (H) 12/20/2019 1113   CALCIUM 9.8 01/23/2021 1104   PROT 7.0 01/23/2021 1104   ALBUMIN 4.2 01/23/2021 1104   AST 21 01/23/2021 1104   ALT 27 01/23/2021 1104   ALKPHOS 135 (H) 01/23/2021 1104   BILITOT 0.5 01/23/2021 1104   GFRNONAA 32 (L) 12/20/2019 1113   GFRAA 37 (L) 12/20/2019 1113     Diabetic Labs (most recent): Lab Results  Component Value Date   HGBA1C 7.3 (A) 06/04/2021   HGBA1C 7.1 (A) 01/30/2021   HGBA1C 7.2 (A) 09/16/2020     Lipid Panel ( most recent) Lipid Panel     Component Value Date/Time   CHOL 191 01/23/2021 1104   TRIG 95 01/23/2021 1104   HDL 53 01/23/2021 1104   CHOLHDL 3.6 01/23/2021 1104   LDLCALC 121 (H) 01/23/2021 1104   LABVLDL 17 01/23/2021 1104      Lab Results  Component Value Date   TSH 2.320 01/23/2021   TSH 3.19 12/20/2019   TSH 1.25 03/16/2011   FREET4 0.98 01/23/2021   FREET4 0.9 12/20/2019       Assessment & Plan:   1. Uncontrolled type 2 diabetes mellitus with hyperglycemia (HCC)  - DESI CARBY has currently uncontrolled symptomatic type 2 DM since  71 years of age.  Carlisha presents with her CGM device.  Her most recent 2 weeks average blood glucose is 155 mg per DL.  Her AGP report shows 73% hemorrhage, 22% level 1 hyperglycemia, 6% level 2 hyperglycemia.  No hypoglycemia.  Her point-of-care A1c is 7.3%, unchanged from her last visit A1c.    Patient presents with great satisfaction, losing 10 pounds since last year.  - I had a long discussion with her about the progressive nature of diabetes and the pathology behind its complications. -She does not  report gross complications from her diabetes, however she remains at a high risk for more acute and chronic complications which include CAD, CVA, CKD, retinopathy, and neuropathy. These are all discussed in detail with her.  - I have counseled her on diet  and weight management  by adopting a carbohydrate restricted/protein rich diet. Patient is encouraged to switch to  unprocessed or minimally processed     complex starch and increased protein intake (animal or plant source), fruits, and vegetables. -  she is advised to stick to a routine mealtimes to eat 3 meals  a day and avoid unnecessary snacks ( to snack only to correct hypoglycemia).   - she acknowledges that there is a room for improvement in her food and drink choices. - Suggestion is made for her to avoid simple carbohydrates  from her diet including Cakes, Sweet Desserts, Ice Cream, Soda (diet and regular), Sweet Tea, Candies, Chips, Cookies, Store Bought Juices, Alcohol , Artificial Sweeteners,  Coffee Creamer, and "Sugar-free" Products, Lemonade. This will help patient to have more stable blood glucose profile and potentially avoid unintended weight gain.  The following Lifestyle Medicine recommendations according to American College of Lifestyle Medicine  [facility]) were discussed and and offered to patient and she  agrees to start the journey:  A. Whole Foods, Plant-Based Nutrition comprising of fruits and vegetables, plant-based proteins, whole-grain carbohydrates was discussed in detail with the patient.   A list for source of those nutrients were also provided to the patient.  Patient will use only water or unsweetened tea for hydration. B.  The need to stay away from risky substances including alcohol, smoking; obtaining 7 to 9 hours of restorative sleep, at least 150 minutes of moderate intensity exercise weekly, the importance of healthy social connections,  and stress management techniques were discussed. C.  A full color page of   Calorie density of various food groups per pound showing examples of each food groups was provided to the patient.    - she will be scheduled with Norm Salt, RDN, CDE for diabetes education.  - I have approached her with the following individualized plan to manage  her diabetes and patient agrees:   - She is benefiting and achieving more with de-escalation of medication.  She is advised to continue Lantus 30 units nightly as her only medication for treating diabetes at this time.  She is taken off of Actos.  She does not tolerate metformin.    She is encouraged to use her CGM at all times.   - she is encouraged to call clinic for blood glucose levels  less than 70 or above 150 mg per DL at fasting.    - she will be considered for incretin therapy as if necessary, appropriate next visit.  - Specific targets for  A1c;  LDL, HDL,  and Triglycerides were discussed with the patient.  2) Blood Pressure /Hypertension:   -Her blood pressure is controlled to target  . she is advised to continue her current medications including metoprolol 25 mg p.o. daily, telmisartan 80 mg p.o. daily at breakfast.     3) Lipids/Hyperlipidemia:   Review of her recent lipid panel showed  controlled  LDL  at 871 .  she  is advised to continue Crestor 10 mg p.o. daily at bedtime.      Side effects and precautions discussed with her.    4)  Weight/Diet:  Body mass index is 28.87 kg/m.  -      she is  a candidate for weight loss. I discussed with her the fact that loss of 5 - 10% of her  current body weight will have the most impact on her diabetes management.  Exercise, and detailed carbohydrates information provided  -  detailed on discharge instructions.  5) Chronic Care/Health Maintenance:  -she  is on ACEI/ARB and Statin medications and  is encouraged to initiate and continue to follow up with Ophthalmology, Dentist,  Podiatrist at least yearly or according to recommendations, and advised to   stay away from  smoking. I have recommended yearly flu vaccine and pneumonia vaccine at least every 5 years; moderate intensity exercise for up to 150 minutes weekly; and  sleep for at least 7 hours a day. -She is not using any potassium supplements.  She is advised to cut back on potassium rich food items such as oranges, bananas.  She will need repeat CMP before her next visit.  Her screening ABI was negative in December 2021.  Her neck study will be due in December 2026, or sooner if needed.    - she is  advised to maintain close follow up with Ralene OkMoreira, Roy, MD for primary care needs, as well as her other providers for optimal and coordinated care.     I spent 32 minutes in the care of the patient today including review of labs from CMP, Lipids, Thyroid Function, Hematology (current and previous including abstractions from other facilities); face-to-face time discussing  her blood glucose readings/logs, discussing hypoglycemia and hyperglycemia episodes and symptoms, medications doses, her options of short and long term treatment based on the latest standards of care / guidelines;  discussion about incorporating lifestyle medicine;  and documenting the encounter.    Please refer to Patient Instructions for Blood Glucose Monitoring and Insulin/Medications Dosing Guide"  in media tab for additional information. Please  also refer to " Patient Self Inventory" in the Media  tab for reviewed elements of pertinent patient history.  Bradd BurnerPatricia F Rissler participated in the discussions, expressed understanding, and voiced agreement with the above plans.  All questions were answered to her satisfaction. she is encouraged to contact clinic should she have any questions or concerns prior to her return visit.    Follow up plan: - Return in about 4 months (around 10/04/2021) for F/U with Pre-visit Labs, Meter/CGM/Logs, A1c here, Urine MA - [place].  Marquis LunchGebre Nahiem Dredge, MD [facility] [address] [place], [place] [address] Phone: [phone]  Fax: [phone]    06/04/2021, 2:22 PM  This note was partially dictated with voice recognition software. Similar sounding words can be transcribed inadequately or may not  be corrected upon review.

## 2021-06-04 NOTE — Patient Instructions (Signed)

## 2021-06-05 ENCOUNTER — Telehealth: Payer: Self-pay | Admitting: "Endocrinology

## 2021-06-05 DIAGNOSIS — E1122 Type 2 diabetes mellitus with diabetic chronic kidney disease: Secondary | ICD-10-CM

## 2021-06-05 DIAGNOSIS — E782 Mixed hyperlipidemia: Secondary | ICD-10-CM

## 2021-06-05 NOTE — Telephone Encounter (Signed)
Pt said she seen on her paper work from yesterday she noticed that Dr Fransico Him wrote to take 30 units of her Insulin. She said she had been taking 45 so she is wanting to know why it was decreased

## 2021-06-06 NOTE — Telephone Encounter (Signed)
Left a message requesting pt return call to the office. ?

## 2021-06-10 NOTE — Telephone Encounter (Signed)
Left a message requesting pt return call to the office. ?

## 2021-06-11 MED ORDER — ROSUVASTATIN CALCIUM 20 MG PO TABS: 10.0000 mg | ORAL_TABLET | Freq: Every day | ORAL | 1 refills | Status: DC

## 2021-06-11 MED ORDER — LANTUS SOLOSTAR 100 UNIT/ML ~~LOC~~ SOPN: 45.0000 [IU] | PEN_INJECTOR | Freq: Every day | SUBCUTANEOUS | 0 refills | Status: DC

## 2021-06-11 NOTE — Addendum Note (Signed)
Addended by: Derrell Lolling on: 06/11/2021 04:17 PM   Modules accepted: Orders

## 2021-06-11 NOTE — Telephone Encounter (Signed)
Discussed with pt, understanding voiced. Pt also requested Rx refills for lantus and crestor. Rx refills sent in to [facility] as requested.

## 2021-06-11 NOTE — Telephone Encounter (Signed)
Pt returning your call. [phone]

## 2021-06-17 ENCOUNTER — Other Ambulatory Visit: Payer: Self-pay | Admitting: Internal Medicine

## 2021-06-17 DIAGNOSIS — Z1231 Encounter for screening mammogram for malignant neoplasm of breast: Secondary | ICD-10-CM

## 2021-08-22 ENCOUNTER — Other Ambulatory Visit: Payer: Self-pay | Admitting: "Endocrinology

## 2021-08-22 DIAGNOSIS — Z794 Long term (current) use of insulin: Secondary | ICD-10-CM

## 2021-10-06 ENCOUNTER — Ambulatory Visit: Payer: Medicare Other | Admitting: "Endocrinology

## 2021-10-30 LAB — LIPID PANEL
Chol/HDL Ratio: 4.7 ratio — ABNORMAL HIGH (ref 0.0–4.4)
Cholesterol, Total: 214 mg/dL — ABNORMAL HIGH (ref 100–199)
HDL: 46 mg/dL (ref >39)
LDL Chol Calc (NIH): 148 mg/dL — ABNORMAL HIGH (ref 0–99)
Triglycerides: 110 mg/dL (ref 0–149)
VLDL Cholesterol Cal: 20 mg/dL (ref 5–40)

## 2021-10-30 LAB — COMPREHENSIVE METABOLIC PANEL
ALT: 26 IU/L (ref 0–32)
AST: 22 IU/L (ref 0–40)
Albumin/Globulin Ratio: 1.2 (ref 1.2–2.2)
Albumin: 3.9 g/dL (ref 3.9–4.9)
Alkaline Phosphatase: 102 IU/L (ref 44–121)
BUN/Creatinine Ratio: 14 (ref 12–28)
BUN: 19 mg/dL (ref 8–27)
Bilirubin Total: 0.4 mg/dL (ref 0.0–1.2)
CO2: 20 mmol/L (ref 20–29)
Calcium: 10.3 mg/dL (ref 8.7–10.3)
Chloride: 106 mmol/L (ref 96–106)
Creatinine, Ser: 1.33 mg/dL — ABNORMAL HIGH (ref 0.57–1.00)
Globulin, Total: 3.2 g/dL (ref 1.5–4.5)
Glucose: 79 mg/dL (ref 70–99)
Potassium: 4.9 mmol/L (ref 3.5–5.2)
Sodium: 141 mmol/L (ref 134–144)
Total Protein: 7.1 g/dL (ref 6.0–8.5)
eGFR: 43 mL/min/{1.73_m2} — ABNORMAL LOW (ref >59)

## 2021-11-20 ENCOUNTER — Other Ambulatory Visit: Payer: Self-pay | Admitting: "Endocrinology

## 2021-11-20 DIAGNOSIS — Z794 Long term (current) use of insulin: Secondary | ICD-10-CM

## 2021-12-23 ENCOUNTER — Other Ambulatory Visit: Payer: Self-pay | Admitting: "Endocrinology

## 2021-12-23 DIAGNOSIS — E1122 Type 2 diabetes mellitus with diabetic chronic kidney disease: Secondary | ICD-10-CM

## 2021-12-23 DIAGNOSIS — E782 Mixed hyperlipidemia: Secondary | ICD-10-CM

## 2022-01-09 ENCOUNTER — Encounter (HOSPITAL_COMMUNITY): Payer: Self-pay | Admitting: Internal Medicine

## 2022-01-13 ENCOUNTER — Ambulatory Visit: Payer: Medicare Other | Admitting: "Endocrinology

## 2022-01-20 ENCOUNTER — Ambulatory Visit (INDEPENDENT_AMBULATORY_CARE_PROVIDER_SITE_OTHER): Payer: Medicare Other | Admitting: "Endocrinology

## 2022-01-20 ENCOUNTER — Encounter: Payer: Self-pay | Admitting: "Endocrinology

## 2022-01-20 VITALS — BP 118/74 | HR 72 | Ht 63.0 in | Wt 147.0 lb

## 2022-01-20 DIAGNOSIS — I10 Essential (primary) hypertension: Secondary | ICD-10-CM | POA: Diagnosis not present

## 2022-01-20 DIAGNOSIS — E782 Mixed hyperlipidemia: Secondary | ICD-10-CM

## 2022-01-20 DIAGNOSIS — E1122 Type 2 diabetes mellitus with diabetic chronic kidney disease: Secondary | ICD-10-CM | POA: Diagnosis not present

## 2022-01-20 DIAGNOSIS — N182 Chronic kidney disease, stage 2 (mild): Secondary | ICD-10-CM | POA: Diagnosis not present

## 2022-01-20 DIAGNOSIS — Z794 Long term (current) use of insulin: Secondary | ICD-10-CM | POA: Diagnosis not present

## 2022-01-20 LAB — POCT UA - MICROALBUMIN
Albumin/Creatinine Ratio, Urine, POC: <30
Creatinine, POC: 200 mg/dL
Microalbumin Ur, POC: 30 mg/L

## 2022-01-20 MED ORDER — LANTUS SOLOSTAR 100 UNIT/ML ~~LOC~~ SOPN: 50.0000 [IU] | PEN_INJECTOR | Freq: Every day | SUBCUTANEOUS | 1 refills | Status: DC

## 2022-01-20 NOTE — Patient Instructions (Signed)

## 2022-01-20 NOTE — Progress Notes (Signed)
01/20/2022, 5:00 PM  Endocrinology follow-up note   Subjective:    Patient ID: Sheryl Parker, female    DOB: 04/24/1950.  Sheryl Parker is being seen in follow-up after she was seen in consultation for management of currently uncontrolled symptomatic diabetes requested by  Jilda Panda, MD.   Past Medical History:  Diagnosis Date   Arthritis    Attention deficit    Dr. Lattie Haw Peloski-Schurz   Chronic back pain    hx chronic narcotics- Dr in [place] recent  meds since 2 yrs ago   Complication of anesthesia    Disc degeneration    Diverticulitis    Fibromyalgia    Hypertension    IBS (irritable bowel syndrome)    Migraine    Osteoporosis    PONV (postoperative nausea and vomiting)    Raynaud disease     Past Surgical History:  Procedure Laterality Date   I & D EXTREMITY Left 03/23/2014   Procedure: IRRIGATION AND DEBRIDEMENT LEFT FINGER;  Surgeon: Iran Planas, MD;  Location: [facility];  Service: Orthopedics;  Laterality: Left;   OOPHORECTOMY  2002   right   TUBAL LIGATION      Social History   Socioeconomic History   Marital status: Married    Spouse name: Not on file   Number of children: 1   Years of education: Not on file   Highest education level: Not on file  Occupational History   Occupation: DISABLED-[employer]  Tobacco Use   Smoking status: Former    Packs/day: 0.20    Years: 8.00    Total pack years: 1.60    Types: Cigarettes    Quit date: 01/05/2018    Years since quitting: 4.0   Smokeless tobacco: Never   Tobacco comments:    1-3 cigarettes  Substance and Sexual Activity   Alcohol use: No    Comment: HEAVY ETOH X 25 YRS, 8 YRS AGO   Drug use: No   Sexual activity: Not on file  Other Topics Concern   Not on file  Social History Narrative   1 SON-Hep C, polysubstance abuse   Social Determinants of Health   Financial Resource Strain:  Not on file  Food Insecurity: Not on file  Transportation Needs: Not on file  Physical Activity: Not on file  Stress: Not on file  Social Connections: Not on file    Family History  Problem Relation Age of Onset   Hypertension Mother    Hyperlipidemia Mother    Hypertension Father    Hyperlipidemia Father    Heart attack Father    Colon polyps Brother    Cirrhosis Brother        etoh   Breast cancer Sister    Liver disease Son        Hepatitis C    Outpatient Encounter Medications as of 01/20/2022  Medication Sig   Continuous Blood Gluc Sensor (DEXCOM G7 SENSOR) MISC 1 each by Does not apply route.   ALPRAZolam (XANAX) 1 MG tablet Take 1 tablet by mouth 3 (three) times daily as needed.   amphetamine-dextroamphetamine (ADDERALL)  20 MG tablet Take 20 mg by mouth 2 (two) times daily. (Patient not taking: Reported on 06/04/2021)   APPLE CIDER VINEGAR PO Take 1 tablet by mouth daily.   ARIPiprazole (ABILIFY) 5 MG tablet Take 2.5-5 mg by mouth daily.   buPROPion (WELLBUTRIN XL) 300 MG 24 hr tablet Take 300 mg by mouth daily.   diltiazem (CARDIZEM CD) 120 MG 24 hr capsule Take 120 mg by mouth daily.   insulin glargine (LANTUS SOLOSTAR) 100 UNIT/ML Solostar Pen Inject 50 Units into the skin at bedtime.   metoprolol succinate (TOPROL-XL) 25 MG 24 hr tablet Take 25 mg by mouth daily.   Multiple Vitamin (MULITIVITAMIN WITH MINERALS) TABS Take 1 tablet by mouth daily.   rizatriptan (MAXALT) 10 MG tablet Take 10 mg by mouth as needed. May repeat in 2 hours if needed for migraines.   rosuvastatin (CRESTOR) 20 MG tablet TAKE ONE-HALF (1/2) TABLET DAILY   telmisartan (MICARDIS) 80 MG tablet Take 80 mg by mouth daily.   temazepam (RESTORIL) 22.5 MG capsule Take 1 capsule by mouth at bedtime.   VYVANSE 70 MG capsule Take 70 mg by mouth every morning. (Patient not taking: Reported on 01/20/2022)   [DISCONTINUED] Continuous Blood Gluc Receiver (FREESTYLE LIBRE 2 READER) DEVI As directed    [DISCONTINUED] Continuous Blood Gluc Sensor (FREESTYLE LIBRE 2 SENSOR) MISC 1 Piece by Does not apply route every 14 (fourteen) days.   [DISCONTINUED] insulin glargine (LANTUS SOLOSTAR) 100 UNIT/ML Solostar Pen Inject 30 Units into the skin at bedtime. (Patient taking differently: Inject 45 Units into the skin at bedtime.)   No facility-administered encounter medications on file as of 01/20/2022.    ALLERGIES: Allergies  Allergen Reactions   Penicillins Anaphylaxis   Sulfa Drugs Cross Reactors Anaphylaxis   Codeine Nausea And Vomiting   Levofloxacin Other (See Comments)    Severe sweating and dhydration    Prednisone Other (See Comments)    Visual disturbances and migraines     VACCINATION STATUS: Immunization History  Administered Date(s) Administered   Influenza,inj,Quad PF,6+ Mos 10/11/2017, 10/12/2018    Diabetes She presents for her follow-up diabetic visit. She has type 2 diabetes mellitus. Onset time: She was diagnosed at approximate age of 72 years. Her disease course has been stable. There are no hypoglycemic associated symptoms. Pertinent negatives for hypoglycemia include no confusion, headaches, pallor or seizures. Pertinent negatives for diabetes include no chest pain, no fatigue, no polydipsia, no polyphagia and no polyuria. There are no hypoglycemic complications. Symptoms are stable. There are no diabetic complications. Risk factors for coronary artery disease include diabetes mellitus, dyslipidemia, hypertension and post-menopausal. Current diabetic treatments: She is currently on Lantus 28 units nightly,, and pioglitazone 50 mg daily. Her weight is decreasing steadily. She is following a generally unhealthy diet. When asked about meal planning, she reported none. She participates in exercise intermittently. Her home blood glucose trend is decreasing steadily. Her breakfast blood glucose range is generally 140-180 mg/dl. Her bedtime blood glucose range is generally 140-180  mg/dl. Her overall blood glucose range is 140-180 mg/dl. Trupp presents with her CGM device.  Her most recent 2 weeks average blood glucose is 152-165.  Her point-of-care A1c is 7.2%, overall improvement.  It is not changed significantly from last visit presentation.   ) An ACE inhibitor/angiotensin II receptor blocker is being taken.  Hyperlipidemia This is a chronic problem. The current episode started more than 1 year ago. The problem is controlled. Pertinent negatives include no chest pain, myalgias or shortness  of breath. Current antihyperlipidemic treatment includes statins. Risk factors for coronary artery disease include dyslipidemia, diabetes mellitus, family history, hypertension and post-menopausal.  Hypertension This is a chronic problem. The current episode started more than 1 year ago. Pertinent negatives include no chest pain, headaches, palpitations or shortness of breath. Risk factors for coronary artery disease include diabetes mellitus, dyslipidemia and post-menopausal state. Past treatments include angiotensin blockers.     Review of Systems  Constitutional:  Negative for chills, fatigue, fever and unexpected weight change.  HENT:  Negative for trouble swallowing and voice change.   Eyes:  Negative for visual disturbance.  Respiratory:  Negative for cough, shortness of breath and wheezing.   Cardiovascular:  Negative for chest pain, palpitations and leg swelling.  Gastrointestinal:  Negative for diarrhea, nausea and vomiting.  Endocrine: Negative for cold intolerance, heat intolerance, polydipsia, polyphagia and polyuria.  Musculoskeletal:  Negative for arthralgias and myalgias.  Skin:  Negative for color change, pallor, rash and wound.  Neurological:  Negative for seizures and headaches.  Psychiatric/Behavioral:  Negative for confusion and suicidal ideas.     Objective:       01/20/2022    3:36 PM 06/04/2021    1:36 PM 04/09/2021    1:32 PM  Vitals with BMI   Height 5\' 3"  5\' 3"  5\' 3"   Weight 147 lbs 163 lbs 159 lbs  BMI 26.05 96.29 52.84  Systolic 132 440   Diastolic 74 82   Pulse 72 84     BP 118/74   Pulse 72   Ht 5\' 3"  (1.6 m)   Wt 147 lb (66.7 kg)   BMI 26.04 kg/m   Wt Readings from Last 3 Encounters:  01/20/22 147 lb (66.7 kg)  06/04/21 163 lb (73.9 kg)  04/09/21 159 lb (72.1 kg)      CMP ( most recent) CMP     Component Value Date/Time   NA 141 10/29/2021 1357   K 4.9 10/29/2021 1357   CL 106 10/29/2021 1357   CO2 20 10/29/2021 1357   GLUCOSE 79 10/29/2021 1357   GLUCOSE 180 (H) 12/20/2019 1113   BUN 19 10/29/2021 1357   CREATININE 1.33 (H) 10/29/2021 1357   CREATININE 1.62 (H) 12/20/2019 1113   CALCIUM 10.3 10/29/2021 1357   PROT 7.1 10/29/2021 1357   ALBUMIN 3.9 10/29/2021 1357   AST 22 10/29/2021 1357   ALT 26 10/29/2021 1357   ALKPHOS 102 10/29/2021 1357   BILITOT 0.4 10/29/2021 1357   GFRNONAA 32 (L) 12/20/2019 1113   GFRAA 37 (L) 12/20/2019 1113     Diabetic Labs (most recent): Lab Results  Component Value Date   HGBA1C 7.3 (A) 06/04/2021   HGBA1C 7.1 (A) 01/30/2021   HGBA1C 7.2 (A) 09/16/2020   MICROALBUR 30 01/20/2022     Lipid Panel ( most recent) Lipid Panel     Component Value Date/Time   CHOL 214 (H) 10/29/2021 1357   TRIG 110 10/29/2021 1357   HDL 46 10/29/2021 1357   CHOLHDL 4.7 (H) 10/29/2021 1357   LDLCALC 148 (H) 10/29/2021 1357   LABVLDL 20 10/29/2021 1357      Lab Results  Component Value Date   TSH 2.320 01/23/2021   TSH 3.19 12/20/2019   TSH 1.25 03/16/2011   FREET4 0.98 01/23/2021   FREET4 0.9 12/20/2019       Assessment & Plan:   1. Uncontrolled type 2 diabetes mellitus with hyperglycemia (HCC)  - Sheryl Parker has currently uncontrolled symptomatic type 2  DM since  72 years of age.  Mahana presents with her CGM device.  Her most recent 2 weeks average blood glucose is 152-165.  Her point-of-care A1c is 7.2%, overall improvement.  It is not changed  significantly from last visit presentation.  Patient presents with great satisfaction, losing 10 pounds since last year.  - I had a long discussion with her about the progressive nature of diabetes and the pathology behind its complications. -She does not report gross complications from her diabetes, however she remains at a high risk for more acute and chronic complications which include CAD, CVA, CKD, retinopathy, and neuropathy. These are all discussed in detail with her.  - I have counseled her on diet  and weight management  by adopting a carbohydrate restricted/protein rich diet. Patient is encouraged to switch to  unprocessed or minimally processed     complex starch and increased protein intake (animal or plant source), fruits, and vegetables. -  she is advised to stick to a routine mealtimes to eat 3 meals  a day and avoid unnecessary snacks ( to snack only to correct hypoglycemia).   - she acknowledges that there is a room for improvement in her food and drink choices. - Suggestion is made for her to avoid simple carbohydrates  from her diet including Cakes, Sweet Desserts, Ice Cream, Soda (diet and regular), Sweet Tea, Candies, Chips, Cookies, Store Bought Juices, Alcohol , Artificial Sweeteners,  Coffee Creamer, and "Sugar-free" Products, Lemonade. This will help patient to have more stable blood glucose profile and potentially avoid unintended weight gain.  The following Lifestyle Medicine recommendations according to [place]  [facility]) were discussed and and offered to patient and she  agrees to start the journey:  A. Whole Foods, Plant-Based Nutrition comprising of fruits and vegetables, plant-based proteins, whole-grain carbohydrates was discussed in detail with the patient.   A list for source of those nutrients were also provided to the patient.  Patient will use only water or unsweetened tea for hydration. B.  The need to stay away from risky substances  including alcohol, smoking; obtaining 7 to 9 hours of restorative sleep, at least 150 minutes of moderate intensity exercise weekly, the importance of healthy social connections,  and stress management techniques were discussed. C.  A full color page of  Calorie density of various food groups per pound showing examples of each food groups was provided to the patient.   - she will be scheduled with Jearld Fenton, RDN, CDE for diabetes education.  - I have approached her with the following individualized plan to manage  her diabetes and patient agrees:   -Advised to increase her Lantus to 50 units nightly, associated with monitoring of blood glucose continuously using her CGM.  She did not tolerate  metformin.    She is encouraged to use her CGM at all times.   - she is encouraged to call clinic for blood glucose levels  less than 70 or above 150 mg per DL at fasting.    - she will be considered for GLP-1 receptor agonists as appropriate next visit.      - Specific targets for  A1c;  LDL, HDL,  and Triglycerides were discussed with the patient.  2) Blood Pressure /Hypertension:   -Her blood pressure is controlled to target.  she is advised to continue her current medications including metoprolol 25 mg p.o. daily, telmisartan 80 mg p.o. daily at breakfast.     3) Lipids/Hyperlipidemia:  Her previsit LDL is worsening at 148 from 71.  She is advised to be consistent with her Crestor, increase to 20 mg p.o. nightly.  Whole food plant-based diet will help as detailed above.     Side effects and precautions discussed with her.    4)  Weight/Diet:  Body mass index is 26.04 kg/m.  -      she is  a candidate for weight loss. I discussed with her the fact that loss of 5 - 10% of her  current body weight will have the most impact on her diabetes management.  Exercise, and detailed carbohydrates information provided  -  detailed on discharge instructions.  5) Chronic Care/Health Maintenance:  -she   is on ACEI/ARB and Statin medications and  is encouraged to initiate and continue to follow up with Ophthalmology, Dentist,  Podiatrist at least yearly or according to recommendations, and advised to   stay away from smoking. I have recommended yearly flu vaccine and pneumonia vaccine at least every 5 years; moderate intensity exercise for up to 150 minutes weekly; and  sleep for at least 7 hours a day. -She is not using any potassium supplements.  She is advised to cut back on potassium rich food items such as oranges, bananas.  She will need repeat CMP before her next visit.  Her screening ABI was negative in December 2021.  Her neck study will be due in December 2026, or sooner if needed.    - she is  advised to maintain close follow up with Ralene Ok, MD for primary care needs, as well as her other providers for optimal and coordinated care.    I spent 26 minutes in the care of the patient today including review of labs from CMP, Lipids, Thyroid Function, Hematology (current and previous including abstractions from other facilities); face-to-face time discussing  her blood glucose readings/logs, discussing hypoglycemia and hyperglycemia episodes and symptoms, medications doses, her options of short and long term treatment based on the latest standards of care / guidelines;  discussion about incorporating lifestyle medicine;  and documenting the encounter. Risk reduction counseling performed per USPSTF guidelines to reduce obesity and cardiovascular risk factors.     Please refer to Patient Instructions for Blood Glucose Monitoring and Insulin/Medications Dosing Guide"  in media tab for additional information. Please  also refer to " Patient Self Inventory" in the Media  tab for reviewed elements of pertinent patient history.  Bradd Burner participated in the discussions, expressed understanding, and voiced agreement with the above plans.  All questions were answered to her satisfaction. she  is encouraged to contact clinic should she have any questions or concerns prior to her return visit.   Follow up plan: - Return in about 3 months (around 04/21/2022) for Fasting Labs  in AM B4 8.  Marquis Lunch, MD [facility] [address] [place], [place] [address] Phone: [phone]  Fax: [phone]    01/20/2022, 5:00 PM  This note was partially dictated with voice recognition software. Similar sounding words can be transcribed inadequately or may not  be corrected upon review.

## 2022-01-21 LAB — POCT GLYCOSYLATED HEMOGLOBIN (HGB A1C): HbA1c, POC (controlled diabetic range): 7.2 % — AB (ref 0.0–7.0)

## 2022-01-26 ENCOUNTER — Other Ambulatory Visit: Payer: Self-pay

## 2022-01-26 DIAGNOSIS — E1122 Type 2 diabetes mellitus with diabetic chronic kidney disease: Secondary | ICD-10-CM

## 2022-01-26 NOTE — Telephone Encounter (Signed)
Pt is going to call back with DME suppliers telephone number and address to send Rx for sensors to.

## 2022-01-28 ENCOUNTER — Other Ambulatory Visit: Payer: Self-pay

## 2022-01-28 DIAGNOSIS — Z794 Long term (current) use of insulin: Secondary | ICD-10-CM

## 2022-03-04 LAB — HM DIABETES EYE EXAM

## 2022-03-05 ENCOUNTER — Encounter: Payer: Self-pay | Admitting: Radiology

## 2022-04-15 LAB — LIPID PANEL
Chol/HDL Ratio: 2.5 ratio (ref 0.0–4.4)
Cholesterol, Total: 121 mg/dL (ref 100–199)
HDL: 49 mg/dL (ref >39)
LDL Chol Calc (NIH): 55 mg/dL (ref 0–99)
Triglycerides: 88 mg/dL (ref 0–149)
VLDL Cholesterol Cal: 17 mg/dL (ref 5–40)

## 2022-04-16 ENCOUNTER — Ambulatory Visit (INDEPENDENT_AMBULATORY_CARE_PROVIDER_SITE_OTHER): Payer: Medicare Other | Admitting: "Endocrinology

## 2022-04-16 ENCOUNTER — Encounter: Payer: Self-pay | Admitting: "Endocrinology

## 2022-04-16 VITALS — BP 104/68 | HR 76 | Ht 63.0 in | Wt 145.6 lb

## 2022-04-16 DIAGNOSIS — Z794 Long term (current) use of insulin: Secondary | ICD-10-CM | POA: Diagnosis not present

## 2022-04-16 DIAGNOSIS — E782 Mixed hyperlipidemia: Secondary | ICD-10-CM | POA: Diagnosis not present

## 2022-04-16 DIAGNOSIS — E1122 Type 2 diabetes mellitus with diabetic chronic kidney disease: Secondary | ICD-10-CM

## 2022-04-16 DIAGNOSIS — N182 Chronic kidney disease, stage 2 (mild): Secondary | ICD-10-CM | POA: Diagnosis not present

## 2022-04-16 DIAGNOSIS — I10 Essential (primary) hypertension: Secondary | ICD-10-CM

## 2022-04-16 LAB — POCT GLYCOSYLATED HEMOGLOBIN (HGB A1C): HbA1c, POC (controlled diabetic range): 7 % (ref 0.0–7.0)

## 2022-04-16 MED ORDER — EMPAGLIFLOZIN 10 MG PO TABS
10.0000 mg | ORAL_TABLET | Freq: Every day | ORAL | 1 refills | Status: DC
Start: 2022-04-16 — End: 2022-08-26

## 2022-04-16 MED ORDER — ROSUVASTATIN CALCIUM 10 MG PO TABS: 10.0000 mg | ORAL_TABLET | Freq: Every day | ORAL | 1 refills | Status: DC

## 2022-04-16 NOTE — Patient Instructions (Signed)

## 2022-04-16 NOTE — Progress Notes (Signed)
04/16/2022, 8:06 PM  Endocrinology follow-up note   Subjective:    Patient ID: ADRIEANA Parker, female    DOB: [birth year].  Sheryl Parker is being seen in follow-up after she was seen in consultation for management of currently uncontrolled symptomatic diabetes requested by  Ralene Ok, MD.   Past Medical History:  Diagnosis Date   Arthritis    Attention deficit    Dr. Misty Stanley [place]   Chronic back pain    hx chronic narcotics- Dr in [place] recent  meds since 2 yrs ago   Complication of anesthesia    Disc degeneration    Diverticulitis    Fibromyalgia    Hypertension    IBS (irritable bowel syndrome)    Migraine    Osteoporosis    PONV (postoperative nausea and vomiting)    Raynaud disease     Past Surgical History:  Procedure Laterality Date   I & D EXTREMITY Left 03/23/2014   Procedure: IRRIGATION AND DEBRIDEMENT LEFT FINGER;  Surgeon: Bradly Bienenstock, MD;  Location: [facility];  Service: Orthopedics;  Laterality: Left;   OOPHORECTOMY  2002   right   TUBAL LIGATION      Social History   Socioeconomic History   Marital status: Married    Spouse name: Not on file   Number of children: 1   Years of education: Not on file   Highest education level: Not on file  Occupational History   Occupation: DISABLED-[employer]  Tobacco Use   Smoking status: Former    Packs/day: 0.20    Years: 8.00    Additional pack years: 0.00    Total pack years: 1.60    Types: Cigarettes    Quit date: 01/05/2018    Years since quitting: 4.2   Smokeless tobacco: Never   Tobacco comments:    1-3 cigarettes  Substance and Sexual Activity   Alcohol use: No    Comment: HEAVY ETOH X 25 YRS, 8 YRS AGO   Drug use: No   Sexual activity: Not on file  Other Topics Concern   Not on file  Social History Narrative   1 SON-Hep C, polysubstance abuse   Social Determinants of Health    Financial Resource Strain: Not on file  Food Insecurity: Not on file  Transportation Needs: Not on file  Physical Activity: Not on file  Stress: Not on file  Social Connections: Not on file    Family History  Problem Relation Age of Onset   Hypertension Mother    Hyperlipidemia Mother    Hypertension Father    Hyperlipidemia Father    Heart attack Father    Colon polyps Brother    Cirrhosis Brother        etoh   Breast cancer Sister    Liver disease Son        Hepatitis C    Outpatient Encounter Medications as of 04/16/2022  Medication Sig   empagliflozin (JARDIANCE) 10 MG TABS tablet Take 1 tablet (10 mg total) by mouth daily before breakfast.   ALPRAZolam (XANAX) 1 MG tablet Take 1 tablet by mouth 3 (  three) times daily as needed.   amphetamine-dextroamphetamine (ADDERALL) 20 MG tablet Take 20 mg by mouth 2 (two) times daily. (Patient not taking: Reported on 06/04/2021)   APPLE CIDER VINEGAR PO Take 1 tablet by mouth daily.   ARIPiprazole (ABILIFY) 5 MG tablet Take 2.5-5 mg by mouth daily.   buPROPion (WELLBUTRIN XL) 300 MG 24 hr tablet Take 300 mg by mouth daily.   Continuous Blood Gluc Sensor (DEXCOM G7 SENSOR) MISC 1 each by Does not apply route.   diltiazem (CARDIZEM CD) 120 MG 24 hr capsule Take 120 mg by mouth daily.   insulin glargine (LANTUS SOLOSTAR) 100 UNIT/ML Solostar Pen Inject 50 Units into the skin at bedtime.   metoprolol succinate (TOPROL-XL) 25 MG 24 hr tablet Take 25 mg by mouth daily.   Multiple Vitamin (MULITIVITAMIN WITH MINERALS) TABS Take 1 tablet by mouth daily.   rizatriptan (MAXALT) 10 MG tablet Take 10 mg by mouth as needed. May repeat in 2 hours if needed for migraines.   rosuvastatin (CRESTOR) 20 MG tablet TAKE ONE-HALF (1/2) TABLET DAILY   telmisartan (MICARDIS) 80 MG tablet Take 80 mg by mouth daily.   temazepam (RESTORIL) 22.5 MG capsule Take 1 capsule by mouth at bedtime.   VYVANSE 70 MG capsule Take 70 mg by mouth every morning. (Patient  not taking: Reported on 01/20/2022)   No facility-administered encounter medications on file as of 04/16/2022.    ALLERGIES: Allergies  Allergen Reactions   Penicillins Anaphylaxis   Sulfa Drugs Cross Reactors Anaphylaxis   Codeine Nausea And Vomiting   Levofloxacin Other (See Comments)    Severe sweating and dhydration    Prednisone Other (See Comments)    Visual disturbances and migraines     VACCINATION STATUS: Immunization History  Administered Date(s) Administered   Influenza,inj,Quad PF,6+ Mos 10/11/2017, 10/12/2018    Diabetes She presents for her follow-up diabetic visit. She has type 2 diabetes mellitus. Onset time: She was diagnosed at approximate age of 72 years. Her disease course has been improving. There are no hypoglycemic associated symptoms. Pertinent negatives for hypoglycemia include no confusion, headaches, pallor or seizures. Pertinent negatives for diabetes include no chest pain, no fatigue, no polydipsia, no polyphagia and no polyuria. There are no hypoglycemic complications. Symptoms are stable. There are no diabetic complications. Risk factors for coronary artery disease include diabetes mellitus, dyslipidemia, hypertension and post-menopausal. Current diabetic treatments: She is currently on Lantus 28 units nightly,, and pioglitazone 50 mg daily. Her weight is fluctuating minimally (She wishes to achieve some weight loss.). She is following a generally unhealthy diet. When asked about meal planning, she reported none. She participates in exercise intermittently. Her home blood glucose trend is decreasing steadily. Her breakfast blood glucose range is generally 140-180 mg/dl. Her lunch blood glucose range is generally 140-180 mg/dl. Her dinner blood glucose range is generally 140-180 mg/dl. Her bedtime blood glucose range is generally 140-180 mg/dl. Her overall blood glucose range is 140-180 mg/dl. Sheryl Parker(Sheryl Parker presents with her CGM device.  She has 59% time in range, 20%  level 1 hyperglycemia, 19% level 2 hyperglycemia.  Her most recent 2 weeks average blood glucose is 176.  Her point-of-care A1c is 7%, progressively improving.  She did not document or report hypoglycemia.     ) An ACE inhibitor/angiotensin II receptor blocker is being taken.  Hyperlipidemia This is a chronic problem. The current episode started more than 1 year ago. The problem is controlled. Pertinent negatives include no chest pain, myalgias or shortness of  breath. Current antihyperlipidemic treatment includes statins. Risk factors for coronary artery disease include dyslipidemia, diabetes mellitus, family history, hypertension and post-menopausal.  Hypertension This is a chronic problem. The current episode started more than 1 year ago. Pertinent negatives include no chest pain, headaches, palpitations or shortness of breath. Risk factors for coronary artery disease include diabetes mellitus, dyslipidemia and post-menopausal state. Past treatments include angiotensin blockers.     Review of Systems  Constitutional:  Negative for chills, fatigue, fever and unexpected weight change.  HENT:  Negative for trouble swallowing and voice change.   Eyes:  Negative for visual disturbance.  Respiratory:  Negative for cough, shortness of breath and wheezing.   Cardiovascular:  Negative for chest pain, palpitations and leg swelling.  Gastrointestinal:  Negative for diarrhea, nausea and vomiting.  Endocrine: Negative for cold intolerance, heat intolerance, polydipsia, polyphagia and polyuria.  Musculoskeletal:  Negative for arthralgias and myalgias.  Skin:  Negative for color change, pallor, rash and wound.  Neurological:  Negative for seizures and headaches.  Psychiatric/Behavioral:  Negative for confusion and suicidal ideas.     Objective:       04/16/2022    2:42 PM 01/20/2022    3:36 PM 06/04/2021    1:36 PM  Vitals with BMI  Height 5\' 3"  5\' 3"  5\' 3"   Weight 145 lbs 10 oz 147 lbs 163 lbs   BMI 25.8 26.05 28.88  Systolic 104 118 673  Diastolic 68 74 82  Pulse 76 72 84    BP 104/68   Pulse 76   Ht 5\' 3"  (1.6 m)   Wt 145 lb 9.6 oz (66 kg)   BMI 25.79 kg/m   Wt Readings from Last 3 Encounters:  04/16/22 145 lb 9.6 oz (66 kg)  01/20/22 147 lb (66.7 kg)  06/04/21 163 lb (73.9 kg)      CMP ( most recent) CMP     Component Value Date/Time   NA 141 10/29/2021 1357   K 4.9 10/29/2021 1357   CL 106 10/29/2021 1357   CO2 20 10/29/2021 1357   GLUCOSE 79 10/29/2021 1357   GLUCOSE 180 (H) 12/20/2019 1113   BUN 19 10/29/2021 1357   CREATININE 1.33 (H) 10/29/2021 1357   CREATININE 1.62 (H) 12/20/2019 1113   CALCIUM 10.3 10/29/2021 1357   PROT 7.1 10/29/2021 1357   ALBUMIN 3.9 10/29/2021 1357   AST 22 10/29/2021 1357   ALT 26 10/29/2021 1357   ALKPHOS 102 10/29/2021 1357   BILITOT 0.4 10/29/2021 1357   GFRNONAA 32 (L) 12/20/2019 1113   GFRAA 37 (L) 12/20/2019 1113     Diabetic Labs (most recent): Lab Results  Component Value Date   HGBA1C 7.0 04/16/2022   HGBA1C 7.2 (A) 01/21/2022   HGBA1C 7.3 (A) 06/04/2021   MICROALBUR 30 01/20/2022     Lipid Panel ( most recent) Lipid Panel     Component Value Date/Time   CHOL 121 04/14/2022 1151   TRIG 88 04/14/2022 1151   HDL 49 04/14/2022 1151   CHOLHDL 2.5 04/14/2022 1151   LDLCALC 55 04/14/2022 1151   LABVLDL 17 04/14/2022 1151      Lab Results  Component Value Date   TSH 2.320 01/23/2021   TSH 3.19 12/20/2019   TSH 1.25 03/16/2011   FREET4 0.98 01/23/2021   FREET4 0.9 12/20/2019       Assessment & Plan:   1. Uncontrolled type 2 diabetes mellitus with hyperglycemia (HCC)  - RHELDA HEITZMANN has currently uncontrolled symptomatic type  2 DM since  72 years of age.  Sariya presents with her CGM device.  She has 59% time in range, 20% level 1 hyperglycemia, 19% level 2 hyperglycemia.  Her most recent 2 weeks average blood glucose is 176.  Her point-of-care A1c is 7%, progressively improving.  She  did not document or report hypoglycemia.    Patient presents with great satisfaction, losing 10 pounds since last year.  - I had a long discussion with her about the progressive nature of diabetes and the pathology behind its complications. -She does not report gross complications from her diabetes, however she remains at a high risk for more acute and chronic complications which include CAD, CVA, CKD, retinopathy, and neuropathy. These are all discussed in detail with her.  - I have counseled her on diet  and weight management  by adopting a carbohydrate restricted/protein rich diet. Patient is encouraged to switch to  unprocessed or minimally processed     complex starch and increased protein intake (animal or plant source), fruits, and vegetables. -  she is advised to stick to a routine mealtimes to eat 3 meals  a day and avoid unnecessary snacks ( to snack only to correct hypoglycemia).  She is engaging and benefiting significantly from lifestyle medicine. - she acknowledges that there is a room for improvement in her food and drink choices. - Suggestion is made for her to avoid simple carbohydrates  from her diet including Cakes, Sweet Desserts, Ice Cream, Soda (diet and regular), Sweet Tea, Candies, Chips, Cookies, Store Bought Juices, Alcohol , Artificial Sweeteners,  Coffee Creamer, and "Sugar-free" Products, Lemonade. This will help patient to have more stable blood glucose profile and potentially avoid unintended weight gain.  The following Lifestyle Medicine recommendations according to American College of Lifestyle Medicine  [facility]) were discussed and and offered to patient and she  agrees to start the journey:  A. Whole Foods, Plant-Based Nutrition comprising of fruits and vegetables, plant-based proteins, whole-grain carbohydrates was discussed in detail with the patient.   A list for source of those nutrients were also provided to the patient.  Patient will use only water or unsweetened  tea for hydration. B.  The need to stay away from risky substances including alcohol, smoking; obtaining 7 to 9 hours of restorative sleep, at least 150 minutes of moderate intensity exercise weekly, the importance of healthy social connections,  and stress management techniques were discussed. C.  A full color page of  Calorie density of various food groups per pound showing examples of each food groups was provided to the patient.  - she will be scheduled with Norm Salt, RDN, CDE for diabetes education.  - I have approached her with the following individualized plan to manage  her diabetes and patient agrees:   -She is advised to lower her Lantus to 50 units nightly, advised to continue monitoring her blood glucose using her CGM device continuously.    - she is encouraged to call clinic for blood glucose levels  less than 70 or above 150 mg per DL at fasting.  -She will benefit from low-dose SGLT2 inhibitors.  I discussed initiated Jardiance 10 mg p.o. once a day at breakfast.  Side effects and precautions discussed with her.  - she will be considered for GLP-1 receptor agonists as appropriate next visit.    - Specific targets for  A1c;  LDL, HDL,  and Triglycerides were discussed with the patient.  2) Blood Pressure /Hypertension:   -Her blood  pressure is controlled to target.  she is advised to continue her current medications including metoprolol 25 mg p.o. daily, telmisartan 80 mg p.o. daily at breakfast.     3) Lipids/Hyperlipidemia: The impressive improvement she has is on her LDL dropping from 148 to 55.  This is mainly due to her lifestyle medicine nutrition, however, advised to continue her Crestor at a lower dose of 10 mg p.o. once a day.      Side effects and precautions discussed with her.    4)  Weight/Diet:  Body mass index is 25.79 kg/m.  -      she is  a candidate for weight loss. I discussed with her the fact that loss of 5 - 10% of her  current body weight will have  the most impact on her diabetes management.  Exercise, and detailed carbohydrates information provided  -  detailed on discharge instructions.  5) Chronic Care/Health Maintenance:  -she  is on ACEI/ARB and Statin medications and  is encouraged to initiate and continue to follow up with Ophthalmology, Dentist,  Podiatrist at least yearly or according to recommendations, and advised to   stay away from smoking. I have recommended yearly flu vaccine and pneumonia vaccine at least every 5 years; moderate intensity exercise for up to 150 minutes weekly; and  sleep for at least 7 hours a day. -She is not using any potassium supplements.  She is advised to cut back on potassium rich food items such as oranges, bananas.  She will need repeat CMP before her next visit.  Her screening ABI was negative in December 2021.  Her neck study will be due in December 2026, or sooner if needed.    - she is  advised to maintain close follow up with Ralene Ok, MD for primary care needs, as well as her other providers for optimal and coordinated care.   I spent  27  minutes in the care of the patient today including review of labs from CMP, Lipids, Thyroid Function, Hematology (current and previous including abstractions from other facilities); face-to-face time discussing  her blood glucose readings/logs, discussing hypoglycemia and hyperglycemia episodes and symptoms, medications doses, her options of short and long term treatment based on the latest standards of care / guidelines;  discussion about incorporating lifestyle medicine;  and documenting the encounter. Risk reduction counseling performed per USPSTF guidelines to reduce  cardiovascular risk factors.     Please refer to Patient Instructions for Blood Glucose Monitoring and Insulin/Medications Dosing Guide"  in media tab for additional information. Please  also refer to " Patient Self Inventory" in the Media  tab for reviewed elements of pertinent patient  history.  Bradd Burner participated in the discussions, expressed understanding, and voiced agreement with the above plans.  All questions were answered to her satisfaction. she is encouraged to contact clinic should she have any questions or concerns prior to her return visit.    Follow up plan: - Return in about 4 months (around 08/16/2022) for F/U with Pre-visit Labs, Meter/CGM/Logs, A1c here.  Marquis Lunch, MD [facility] [address] [place], [place] [address] Phone: [phone]  Fax: [phone]    04/16/2022, 8:06 PM  This note was partially dictated with voice recognition software. Similar sounding words can be transcribed inadequately or may not  be corrected upon review.

## 2022-04-22 ENCOUNTER — Ambulatory Visit: Payer: Medicare Other | Admitting: "Endocrinology

## 2022-04-28 ENCOUNTER — Other Ambulatory Visit: Payer: Self-pay | Admitting: "Endocrinology

## 2022-04-28 DIAGNOSIS — N182 Chronic kidney disease, stage 2 (mild): Secondary | ICD-10-CM

## 2022-05-05 ENCOUNTER — Other Ambulatory Visit: Payer: Self-pay

## 2022-05-05 DIAGNOSIS — E1122 Type 2 diabetes mellitus with diabetic chronic kidney disease: Secondary | ICD-10-CM

## 2022-05-05 MED ORDER — LANTUS SOLOSTAR 100 UNIT/ML ~~LOC~~ SOPN
PEN_INJECTOR | SUBCUTANEOUS | 0 refills | Status: DC
Start: 2022-05-05 — End: 2022-09-17

## 2022-05-06 ENCOUNTER — Ambulatory Visit: Payer: Medicare Other | Attending: Internal Medicine | Admitting: Internal Medicine

## 2022-05-06 ENCOUNTER — Encounter: Payer: Self-pay | Admitting: Internal Medicine

## 2022-05-06 VITALS — BP 131/82 | HR 79 | Ht 63.0 in | Wt 146.0 lb

## 2022-05-06 DIAGNOSIS — R0609 Other forms of dyspnea: Secondary | ICD-10-CM | POA: Insufficient documentation

## 2022-05-06 DIAGNOSIS — Z794 Long term (current) use of insulin: Secondary | ICD-10-CM | POA: Insufficient documentation

## 2022-05-06 DIAGNOSIS — E118 Type 2 diabetes mellitus with unspecified complications: Secondary | ICD-10-CM | POA: Insufficient documentation

## 2022-05-06 DIAGNOSIS — R079 Chest pain, unspecified: Secondary | ICD-10-CM | POA: Insufficient documentation

## 2022-05-06 DIAGNOSIS — I10 Essential (primary) hypertension: Secondary | ICD-10-CM | POA: Insufficient documentation

## 2022-05-06 NOTE — Patient Instructions (Signed)
Medication Instructions:  Your physician recommends that you continue on your current medications as directed. Please refer to the Current Medication list given to you today.  *If you need a refill on your cardiac medications before your next appointment, please call your pharmacy*   Lab Work: None If you have labs (blood work) drawn today and your tests are completely normal, you will receive your results only by: MyChart Message (if you have MyChart) OR A paper copy in the mail If you have any lab test that is abnormal or we need to change your treatment, we will call you to review the results.   Testing/Procedures: Your physician has requested that you have an echocardiogram. Echocardiography is a painless test that uses sound waves to create images of your heart. It provides your doctor with information about the size and shape of your heart and how well your heart's chambers and valves are working. This procedure takes approximately one hour. There are no restrictions for this procedure. Please do NOT wear cologne, perfume, aftershave, or lotions (deodorant is allowed). Please arrive 15 minutes prior to your appointment time.  Your physician has requested that you have a lexiscan myoview. For further information please visit [email or web address]. Please follow instruction sheet, as given.    Follow-Up: At [facility], you and your health needs are our priority.  As part of our continuing mission to provide you with exceptional heart care, we have created designated Provider Care Teams.  These Care Teams include your primary Cardiologist (physician) and Advanced Practice Providers (APPs -  Physician Assistants and Nurse Practitioners) who all work together to provide you with the care you need, when you need it.  We recommend signing up for the patient portal called "MyChart".  Sign up information is provided on this After Visit Summary.  MyChart is used to connect with  patients for Virtual Visits (Telemedicine).  Patients are able to view lab/test results, encounter notes, upcoming appointments, etc.  Non-urgent messages can be sent to your provider as well.   To learn more about what you can do with MyChart, go to [email or web address].    Your next appointment:   1 year(s)  Provider:   Vishnu Mallipeddi, MD    Other Instructions      

## 2022-05-06 NOTE — Progress Notes (Signed)
Cardiology Office Note  Date: 05/06/2022   ID: Sheryl Parker, DOB 07-Nov-1950, MRN [id number]  PCP:  Ralene Ok, MD  Cardiologist:  Nanetta Batty, MD Electrophysiologist:  None   Reason for Office Visit: DOE evaluation at the request of Dr Ludwig Clarks   History of Present Illness: Sheryl Parker is a 72 y.o. female known to have HTN, IDDM2, HLD was referred to cardiology clinic for evaluation of DOE.  Patient was initially referred to cardiology clinic in 2019 for DOE which was when echocardiogram was performed and showed normal LV function with no valvular abnormalities. She had mild pulmonary hypertension. NM stress test from 2019 showed partially reversible small, moderate intensity apical septal and apical perfusion defect, possible small area of ischemia. The study was low risk.  Patient is here as a new patient visit, accompanied by husband. Denies having any angina.  Has DOE that has been worsening recently. No leg swelling.  No palpitations, syncope. She does have dizziness when she stands up from sitting position quickly.  No interval history of ER visits or hospitalizations for chest pain.  No interval PCI/CABG.  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  When she was diagnosed with diabetes mellitus, she went downhill is what he said.  Past Medical History:  Diagnosis Date   Arthritis    Attention deficit    Dr. Misty Stanley [place]   Chronic back pain    hx chronic narcotics- Dr in [place] recent  meds since 2 yrs ago   Complication of anesthesia    Disc degeneration    Diverticulitis    Fibromyalgia    Hypertension    IBS (irritable bowel syndrome)    Migraine    Osteoporosis    PONV (postoperative nausea and vomiting)    Raynaud disease     Past Surgical History:  Procedure Laterality Date   I & D EXTREMITY Left 03/23/2014   Procedure: IRRIGATION AND DEBRIDEMENT LEFT FINGER;   Surgeon: Bradly Bienenstock, MD;  Location: [facility];  Service: Orthopedics;  Laterality: Left;   OOPHORECTOMY  2002   right   TUBAL LIGATION      Current Outpatient Medications  Medication Sig Dispense Refill   ALPRAZolam (XANAX) 1 MG tablet Take 1 tablet by mouth 3 (three) times daily as needed.  1   APPLE CIDER VINEGAR PO Take 1 tablet by mouth daily.     ARIPiprazole (ABILIFY) 5 MG tablet Take 2.5-5 mg by mouth daily.     buPROPion (WELLBUTRIN XL) 300 MG 24 hr tablet Take 300 mg by mouth daily.     Continuous Blood Gluc Sensor (DEXCOM G7 SENSOR) MISC 1 each by Does not apply route.     diltiazem (CARDIZEM CD) 120 MG 24 hr capsule Take 120 mg by mouth daily.     empagliflozin (JARDIANCE) 10 MG TABS tablet Take 1 tablet (10 mg total) by mouth daily before breakfast. 90 tablet 1   insulin glargine (LANTUS SOLOSTAR) 100 UNIT/ML Solostar Pen INJECT 50 UNITS UNDER THE SKIN AT BEDTIME 45 mL 0   metoprolol succinate (TOPROL-XL) 25 MG 24 hr tablet Take 25 mg by mouth daily.     Multiple Vitamin (MULITIVITAMIN WITH MINERALS) TABS Take 1 tablet by mouth daily.     rizatriptan (MAXALT) 10 MG tablet Take 10 mg by mouth as needed. May repeat in 2 hours if needed for migraines.     rosuvastatin (CRESTOR) 10 MG  tablet Take 1 tablet (10 mg total) by mouth daily. 90 tablet 1   telmisartan (MICARDIS) 80 MG tablet Take 80 mg by mouth daily.     temazepam (RESTORIL) 22.5 MG capsule Take 1 capsule by mouth at bedtime.  0   amphetamine-dextroamphetamine (ADDERALL) 20 MG tablet Take 20 mg by mouth 2 (two) times daily. (Patient not taking: Reported on 06/04/2021)     VYVANSE 70 MG capsule Take 70 mg by mouth every morning. (Patient not taking: Reported on 01/20/2022)     No current facility-administered medications for this visit.   Allergies:  Penicillins, Sulfa drugs cross reactors, Codeine, Levofloxacin, and Prednisone   Social History: The patient  reports that she quit smoking about 4 years ago. Her smoking use  included cigarettes. She has a 1.60 pack-year smoking history. She has never used smokeless tobacco. She reports that she does not drink alcohol and does not use drugs.   Family History: The patient's family history includes Breast cancer in her sister; Cirrhosis in her brother; Colon polyps in her brother; Heart attack in her father; Hyperlipidemia in her father and mother; Hypertension in her father and mother; Liver disease in her son.   ROS:  Please see the history of present illness. Otherwise, complete review of systems is positive for none.  All other systems are reviewed and negative.   Physical Exam: VS:  BP 131/82 (BP Location: Left Arm, Patient Position: Sitting, Cuff Size: Normal)   Pulse 79   Ht 5\' 3"  (1.6 m)   Wt 146 lb (66.2 kg)   SpO2 98%   BMI 25.86 kg/m , BMI Body mass index is 25.86 kg/m.  Wt Readings from Last 3 Encounters:  05/06/22 146 lb (66.2 kg)  04/16/22 145 lb 9.6 oz (66 kg)  01/20/22 147 lb (66.7 kg)    General: Patient appears comfortable at rest. HEENT: Conjunctiva and lids normal, oropharynx clear with moist mucosa. Neck: Supple, no elevated JVP or carotid bruits, no thyromegaly. Lungs: Clear to auscultation, nonlabored breathing at rest. Cardiac: Regular rate and rhythm, no S3 or significant systolic murmur, no pericardial rub. Abdomen: Soft, nontender, no hepatomegaly, bowel sounds present, no guarding or rebound. Extremities: No pitting edema, distal pulses 2+. Skin: Warm and dry. Musculoskeletal: No kyphosis. Neuropsychiatric: Alert and oriented x3, affect grossly appropriate.  Recent Labwork: 10/29/2021: ALT 26; AST 22; BUN 19; Creatinine, Ser 1.33; Potassium 4.9; Sodium 141     Component Value Date/Time   CHOL 121 04/14/2022 1151   TRIG 88 04/14/2022 1151   HDL 49 04/14/2022 1151   CHOLHDL 2.5 04/14/2022 1151   LDLCALC 55 04/14/2022 1151    Other Studies Reviewed Today:   Assessment and Plan: Patient is a 72 year old F known to have  HTN, IDDM2, HLD was referred to cardiology clinic for evaluation of DOE.  # DOE -Patient has been having DOE for quite a while but reported it worsening recently.  Echocardiogram from 2019 showed normal LVEF and no valve abnormalities. Mild pulmonary hypertension. NM stress test from 2019 showed possible small area of apical ischemia. -Obtain 2D echocardiogram -Obtain Lexiscan (CT cardiac would have been ideal but she has chronic kidney disease from diabetes)  # HTN, controlled -Continue telmisartan 80 mg once daily -On metoprolol and diltiazem  # HLD -Continue rosuvastatin 10 mg nightly, goal LDL less than 100.  # IDDM2 with diabetic nephropathy: Educated on strict glycemic control to prevent complications of diabetes including CAD and HF. Recently started on Jardiance 10 mg once  daily. On insulin. She does have nausea, early satiety which I instructed her to follow-up with PCP or endocrine for further management. Strongly encouraged to start exercising especially in the pool.   I have spent a total of 45 minutes with patient reviewing chart, EKGs, labs and examining patient as well as establishing an assessment and plan that was discussed with the patient.  > 50% of time was spent in direct patient care.    Medication Adjustments/Labs and Tests Ordered: Current medicines are reviewed at length with the patient today.  Concerns regarding medicines are outlined above.   Tests Ordered: No orders of the defined types were placed in this encounter.   Medication Changes: No orders of the defined types were placed in this encounter.   Disposition:  Follow up  1 year  Signed, Verdean Murin Verne Spurr, MD, 05/06/2022 3:40 PM    [facility] at [facility] [address], [place], [place] [address]

## 2022-05-06 DEATH — deceased

## 2022-05-18 ENCOUNTER — Encounter (HOSPITAL_COMMUNITY): Payer: Medicare Other

## 2022-05-18 ENCOUNTER — Encounter (HOSPITAL_COMMUNITY): Admission: RE | Admit: 2022-05-18 | Payer: Medicare Other | Source: Ambulatory Visit

## 2022-06-09 ENCOUNTER — Ambulatory Visit (HOSPITAL_COMMUNITY): Payer: Medicare Other

## 2022-07-25 ENCOUNTER — Emergency Department (HOSPITAL_COMMUNITY)
Admission: EM | Admit: 2022-07-25 | Discharge: 2022-07-25 | Disposition: A | Discharge status: Home / Self Care | Payer: Medicare Other | Attending: Emergency Medicine | Admitting: Emergency Medicine

## 2022-07-25 ENCOUNTER — Encounter (HOSPITAL_COMMUNITY): Payer: Self-pay | Admitting: Emergency Medicine

## 2022-07-25 ENCOUNTER — Other Ambulatory Visit: Payer: Self-pay

## 2022-07-25 ENCOUNTER — Emergency Department (HOSPITAL_COMMUNITY): Payer: Medicare Other

## 2022-07-25 DIAGNOSIS — R1084 Generalized abdominal pain: Secondary | ICD-10-CM | POA: Insufficient documentation

## 2022-07-25 DIAGNOSIS — N3001 Acute cystitis with hematuria: Secondary | ICD-10-CM

## 2022-07-25 DIAGNOSIS — R109 Unspecified abdominal pain: Secondary | ICD-10-CM

## 2022-07-25 DIAGNOSIS — Z79899 Other long term (current) drug therapy: Secondary | ICD-10-CM | POA: Diagnosis not present

## 2022-07-25 DIAGNOSIS — R112 Nausea with vomiting, unspecified: Secondary | ICD-10-CM | POA: Diagnosis present

## 2022-07-25 DIAGNOSIS — I10 Essential (primary) hypertension: Secondary | ICD-10-CM | POA: Insufficient documentation

## 2022-07-25 DIAGNOSIS — R5383 Other fatigue: Secondary | ICD-10-CM | POA: Insufficient documentation

## 2022-07-25 DIAGNOSIS — Z794 Long term (current) use of insulin: Secondary | ICD-10-CM | POA: Diagnosis not present

## 2022-07-25 DIAGNOSIS — E119 Type 2 diabetes mellitus without complications: Secondary | ICD-10-CM | POA: Diagnosis not present

## 2022-07-25 DIAGNOSIS — R531 Weakness: Secondary | ICD-10-CM | POA: Diagnosis not present

## 2022-07-25 LAB — URINALYSIS, W/ REFLEX TO CULTURE (INFECTION SUSPECTED)
Bilirubin Urine: NEGATIVE
Glucose, UA: 50 mg/dL — AB
Hgb urine dipstick: NEGATIVE
Ketones, ur: NEGATIVE mg/dL
Nitrite: NEGATIVE
Protein, ur: NEGATIVE mg/dL
Specific Gravity, Urine: 1.009 (ref 1.005–1.030)
pH: 6 (ref 5.0–8.0)

## 2022-07-25 LAB — I-STAT CHEM 8, ED
BUN: 29 mg/dL — ABNORMAL HIGH (ref 8–23)
Calcium, Ion: 1.21 mmol/L (ref 1.15–1.40)
Chloride: 98 mmol/L (ref 98–111)
Creatinine, Ser: 1.5 mg/dL — ABNORMAL HIGH (ref 0.44–1.00)
Glucose, Bld: 113 mg/dL — ABNORMAL HIGH (ref 70–99)
HCT: 48 % — ABNORMAL HIGH (ref 36.0–46.0)
Hemoglobin: 16.3 g/dL — ABNORMAL HIGH (ref 12.0–15.0)
Potassium: 5 mmol/L (ref 3.5–5.1)
Sodium: 132 mmol/L — ABNORMAL LOW (ref 135–145)
TCO2: 24 mmol/L (ref 22–32)

## 2022-07-25 LAB — CBC WITH DIFFERENTIAL/PLATELET
Abs Immature Granulocytes: 0.03 10*3/uL (ref 0.00–0.07)
Basophils Absolute: 0 10*3/uL (ref 0.0–0.1)
Basophils Relative: 0 %
Eosinophils Absolute: 0.1 10*3/uL (ref 0.0–0.5)
Eosinophils Relative: 1 %
HCT: 45.1 % (ref 36.0–46.0)
Hemoglobin: 15.4 g/dL — ABNORMAL HIGH (ref 12.0–15.0)
Immature Granulocytes: 0 %
Lymphocytes Relative: 20 %
Lymphs Abs: 2.1 10*3/uL (ref 0.7–4.0)
MCH: 31.9 pg (ref 26.0–34.0)
MCHC: 34.1 g/dL (ref 30.0–36.0)
MCV: 93.4 fL (ref 80.0–100.0)
Monocytes Absolute: 1.1 10*3/uL — ABNORMAL HIGH (ref 0.1–1.0)
Monocytes Relative: 10 %
Neutro Abs: 7.3 10*3/uL (ref 1.7–7.7)
Neutrophils Relative %: 69 %
Platelets: 326 10*3/uL (ref 150–400)
RBC: 4.83 MIL/uL (ref 3.87–5.11)
RDW: 12.8 % (ref 11.5–15.5)
WBC: 10.7 10*3/uL — ABNORMAL HIGH (ref 4.0–10.5)
nRBC: 0 % (ref 0.0–0.2)

## 2022-07-25 LAB — COMPREHENSIVE METABOLIC PANEL
ALT: 38 U/L (ref 0–44)
AST: 42 U/L — ABNORMAL HIGH (ref 15–41)
Albumin: 3.5 g/dL (ref 3.5–5.0)
Alkaline Phosphatase: 168 U/L — ABNORMAL HIGH (ref 38–126)
Anion gap: 11 (ref 5–15)
BUN: 32 mg/dL — ABNORMAL HIGH (ref 8–23)
CO2: 25 mmol/L (ref 22–32)
Calcium: 9.9 mg/dL (ref 8.9–10.3)
Chloride: 95 mmol/L — ABNORMAL LOW (ref 98–111)
Creatinine, Ser: 1.43 mg/dL — ABNORMAL HIGH (ref 0.44–1.00)
GFR, Estimated: 39 mL/min — ABNORMAL LOW (ref >60)
Glucose, Bld: 121 mg/dL — ABNORMAL HIGH (ref 70–99)
Potassium: 4.9 mmol/L (ref 3.5–5.1)
Sodium: 131 mmol/L — ABNORMAL LOW (ref 135–145)
Total Bilirubin: 1.1 mg/dL (ref 0.3–1.2)
Total Protein: 6.8 g/dL (ref 6.5–8.1)

## 2022-07-25 LAB — LACTIC ACID, PLASMA
Lactic Acid, Venous: 2 mmol/L (ref 0.5–1.9)
Lactic Acid, Venous: 2 mmol/L (ref 0.5–1.9)

## 2022-07-25 LAB — LIPASE, BLOOD: Lipase: 38 U/L (ref 11–51)

## 2022-07-25 LAB — MAGNESIUM: Magnesium: 2 mg/dL (ref 1.7–2.4)

## 2022-07-25 MED ORDER — LACTATED RINGERS IV BOLUS
1000.0000 mL | Freq: Once | INTRAVENOUS | Status: AC
  Administered 2022-07-25: 1000 mL via INTRAVENOUS

## 2022-07-25 MED ORDER — HYDROMORPHONE HCL 1 MG/ML IJ SOLN
0.5000 mg | Freq: Once | INTRAMUSCULAR | Status: AC
  Administered 2022-07-25: 0.5 mg via INTRAVENOUS
  Filled 2022-07-25: qty 0.5

## 2022-07-25 MED ORDER — FOSFOMYCIN TROMETHAMINE 3 G PO PACK
3.0000 g | PACK | Freq: Once | ORAL | Status: AC
  Administered 2022-07-25: 3 g via ORAL
  Filled 2022-07-25: qty 3

## 2022-07-25 MED ORDER — FENTANYL CITRATE PF 50 MCG/ML IJ SOSY
PREFILLED_SYRINGE | INTRAMUSCULAR | Status: AC
  Filled 2022-07-25: qty 1

## 2022-07-25 MED ORDER — ONDANSETRON HCL 4 MG PO TABS: 4.0000 mg | ORAL_TABLET | Freq: Three times a day (TID) | ORAL | Status: DC | PRN

## 2022-07-25 MED ORDER — METOCLOPRAMIDE HCL 5 MG/ML IJ SOLN
10.0000 mg | Freq: Once | INTRAMUSCULAR | Status: AC
  Administered 2022-07-25: 10 mg via INTRAVENOUS
  Filled 2022-07-25: qty 2

## 2022-07-25 MED ORDER — FENTANYL CITRATE PF 50 MCG/ML IJ SOSY
50.0000 ug | PREFILLED_SYRINGE | Freq: Once | INTRAMUSCULAR | Status: AC
  Administered 2022-07-25: 50 ug via INTRAVENOUS

## 2022-07-25 MED ORDER — ONDANSETRON HCL 4 MG PO TABS: 4.0000 mg | ORAL_TABLET | Freq: Three times a day (TID) | ORAL | 0 refills | Status: DC | PRN

## 2022-07-25 MED ORDER — ONDANSETRON 4 MG PO TBDP: 4.0000 mg | ORAL_TABLET | Freq: Three times a day (TID) | ORAL | 0 refills | Status: DC | PRN

## 2022-07-25 NOTE — ED Triage Notes (Signed)
Pt from home via [facility] with reports of abdominal pain and weakness x 4 days. Pt was given 100cc of fluid en route.

## 2022-07-25 NOTE — ED Provider Notes (Signed)
  Physical Exam  BP (!) 153/88   Pulse 94   Temp 98.1 F (36.7 C) (Oral)   Resp 16   SpO2 98%   Physical Exam Vitals and nursing note reviewed.  Constitutional:      General: She is not in acute distress.    Appearance: She is well-developed.  HENT:     Head: Normocephalic and atraumatic.  Eyes:     Conjunctiva/sclera: Conjunctivae normal.  Cardiovascular:     Rate and Rhythm: Normal rate and regular rhythm.     Heart sounds: No murmur heard. Pulmonary:     Effort: Pulmonary effort is normal. No respiratory distress.     Breath sounds: Normal breath sounds.  Abdominal:     Palpations: Abdomen is soft.     Tenderness: There is no abdominal tenderness.  Musculoskeletal:        General: No swelling.     Cervical back: Neck supple.  Skin:    General: Skin is warm and dry.     Capillary Refill: Capillary refill takes less than 2 seconds.  Neurological:     Mental Status: She is alert.  Psychiatric:        Mood and Affect: Mood normal.     Procedures  Procedures  ED Course / MDM    Medical Decision Making Amount and/or Complexity of Data Reviewed Labs: ordered. Radiology: ordered.  Risk Prescription drug management.   Patient received an handoff.  Abdominal pain and generalized weakness pending urinalysis.  Urinalysis is concerning for infection and given multiple allergies to standard UTI medications, we will pursue single dose of fosfomycin here in the ER.  After fluid resuscitation and nausea control, patient was able to ambulate without difficulty and tolerate p.o. without difficulty.  She states she is feeling significant improved.  Patient will be discharged with ODT Zofran and return precautions which she voiced understanding.  At this time she does not meet inpatient criteria for admission.       Glendora Score, MD 07/25/22 2228

## 2022-07-25 NOTE — ED Notes (Signed)
Pt given crackers and ice water. Instructed to call out when finished for assistance to ambulate to the restroom.

## 2022-07-25 NOTE — ED Provider Notes (Signed)
Sheryl [facility] EMERGENCY DEPARTMENT AT [facility] Provider Note   CSN: [id number] Arrival date & time: 07/25/22  1232     History  Chief Complaint  Patient presents with   Abdominal Pain    Sheryl Parker is a 72 y.o. female.   Abdominal Pain Associated symptoms: fatigue, nausea and vomiting   Patient presents for left flank pain, nausea, vomiting, p.o. intolerance, and generalized weakness.  Medical history includes HTN, HLD, T2DM, migraine headaches, IBS, arthritis, depression, fibromyalgia.  Patient has had worsening symptoms over the past 4 days.  She states that she has not kept anything down over that timeframe.  Pain is primarily located in her left flank.  It does radiate to abdomen and back.  She was given Zofran prior to arrival and does have resolution of nausea.  She denies any recent dysuria.  She denies any recent fevers or chills.  She lives at home with her husband.  When attempting to ambulate today, she did experience lightheadedness and dizziness.     Home Medications Prior to Admission medications   Medication Sig Start Date End Date Taking? Authorizing Provider  ALPRAZolam Prudy Feeler) 1 MG tablet Take 1 tablet by mouth 3 (three) times daily as needed. 01/31/14   [provider]  amphetamine-dextroamphetamine (ADDERALL) 20 MG tablet Take 20 mg by mouth 2 (two) times daily. Patient not taking: Reported on 06/04/2021    [provider]  APPLE CIDER VINEGAR PO Take 1 tablet by mouth daily.    [provider]  ARIPiprazole (ABILIFY) 5 MG tablet Take 2.5-5 mg by mouth daily.    [provider]  buPROPion (WELLBUTRIN XL) 300 MG 24 hr tablet Take 300 mg by mouth daily.    [provider]  Continuous Blood Gluc Sensor (DEXCOM G7 SENSOR) MISC 1 each by Does not apply route.    [provider]  diltiazem (CARDIZEM CD) 120 MG 24 hr capsule Take 120 mg by mouth daily.    [provider]  empagliflozin  (JARDIANCE) 10 MG TABS tablet Take 1 tablet (10 mg total) by mouth daily before breakfast. 04/16/22   Nida, Denman George, MD  insulin glargine (LANTUS SOLOSTAR) 100 UNIT/ML Solostar Pen INJECT 50 UNITS UNDER THE SKIN AT BEDTIME 05/05/22   Roma Kayser, MD  metoprolol succinate (TOPROL-XL) 25 MG 24 hr tablet Take 25 mg by mouth daily.    [provider]  Multiple Vitamin (MULITIVITAMIN WITH MINERALS) TABS Take 1 tablet by mouth daily.    [provider]  rizatriptan (MAXALT) 10 MG tablet Take 10 mg by mouth as needed. May repeat in 2 hours if needed for migraines.    [provider]  rosuvastatin (CRESTOR) 10 MG tablet Take 1 tablet (10 mg total) by mouth daily. 04/16/22   Roma Kayser, MD  telmisartan (MICARDIS) 80 MG tablet Take 80 mg by mouth daily.    [provider]  temazepam (RESTORIL) 22.5 MG capsule Take 1 capsule by mouth at bedtime. 02/22/14   [provider]  VYVANSE 70 MG capsule Take 70 mg by mouth every morning. Patient not taking: Reported on 01/20/2022 02/19/21   [provider]      Allergies    Penicillins, Sulfa drugs cross reactors, Codeine, Levofloxacin, and Prednisone    Review of Systems   Review of Systems  Constitutional:  Positive for fatigue.  Gastrointestinal:  Positive for abdominal pain, nausea and vomiting.  Genitourinary:  Positive for flank pain.  Neurological:  Positive for dizziness, weakness (Generalized) and light-headedness.  All other systems reviewed and are negative.   Physical Exam Updated Vital Signs BP (!) 153/88   Pulse 94   Temp (!) 97.4 F (36.3 C) (Oral)   Resp 16   SpO2 98%  Physical Exam Vitals and nursing note reviewed.  Constitutional:      General: She is not in acute distress.    Appearance: She is well-developed. She is ill-appearing. She is not toxic-appearing or diaphoretic.  HENT:     Head: Normocephalic and atraumatic.     Mouth/Throat:     Mouth:  Mucous membranes are moist.  Eyes:     Conjunctiva/sclera: Conjunctivae normal.  Cardiovascular:     Rate and Rhythm: Normal rate and regular rhythm.     Heart sounds: No murmur heard. Pulmonary:     Effort: Pulmonary effort is normal. No respiratory distress.     Breath sounds: Normal breath sounds. No wheezing or rales.  Chest:     Chest wall: No tenderness.  Abdominal:     General: There is no distension.     Palpations: Abdomen is soft.     Tenderness: There is generalized abdominal tenderness. There is left CVA tenderness. There is no right CVA tenderness, guarding or rebound.  Musculoskeletal:        General: No swelling.     Cervical back: Neck supple.  Skin:    General: Skin is warm and dry.     Capillary Refill: Capillary refill takes more than 3 seconds.     Coloration: Skin is not cyanotic, jaundiced or pale.  Neurological:     General: No focal deficit present.     Mental Status: She is alert and oriented to person, place, and time.  Psychiatric:        Mood and Affect: Mood normal. Affect is flat.        Speech: Speech normal.        Behavior: Behavior is slowed and withdrawn.     ED Results / Procedures / Treatments   Labs (all labs ordered are listed, but only abnormal results are displayed) Labs Reviewed  COMPREHENSIVE METABOLIC PANEL - Abnormal; Notable for the following components:      Result Value   Sodium 131 (*)    Chloride 95 (*)    Glucose, Bld 121 (*)    BUN 32 (*)    Creatinine, Ser 1.43 (*)    AST 42 (*)    Alkaline Phosphatase 168 (*)    GFR, Estimated 39 (*)    All other components within normal limits  CBC WITH DIFFERENTIAL/PLATELET - Abnormal; Notable for the following components:   WBC 10.7 (*)    Hemoglobin 15.4 (*)    Monocytes Absolute 1.1 (*)    All other components within normal limits  LACTIC ACID, PLASMA - Abnormal; Notable for the following components:   Lactic Acid, Venous 2.0 (*)    All other components within normal  limits  LIPASE, BLOOD  MAGNESIUM  LACTIC ACID, PLASMA  URINALYSIS, W/ REFLEX TO CULTURE (INFECTION SUSPECTED)  I-STAT CHEM 8, ED    EKG EKG Interpretation Date/Time:  Saturday July 25 2022 12:39:29 EDT Ventricular Rate:  100 PR Interval:  130 QRS Duration:  82 QT Interval:  340 QTC Calculation: 439 R Axis:   51  Text Interpretation: Sinus tachycardia Probable left atrial enlargement Borderline repolarization abnormality Confirmed by Gloris Manchester [phone]) on 07/25/2022 1:52:22 PM  Radiology CT Renal Larina Bras Study  Result Date: 07/25/2022 CLINICAL DATA:  Abdominal pain and weakness for 4 days. EXAM: CT ABDOMEN AND PELVIS WITHOUT CONTRAST TECHNIQUE: Multidetector CT imaging of the abdomen and pelvis was performed following the standard protocol without IV contrast. RADIATION DOSE REDUCTION: This exam was performed according to the departmental dose-optimization program which includes automated exposure control, adjustment of the mA and/or kV according to patient size and/or use of iterative reconstruction technique. COMPARISON:  Renal ultrasound dated 08/29/2019 and report from CT abdomen and pelvis dated 06/11/2001. FINDINGS: Lower chest: There are mild ground-glass opacities in the left lower lung. There is a large hiatal hernia. Hepatobiliary: No focal liver abnormality is seen. No gallstones, gallbladder wall thickening, or biliary dilatation. Pancreas: Unremarkable. No pancreatic ductal dilatation or surrounding inflammatory changes. Spleen: Normal in size without focal abnormality. Adrenals/Urinary Tract: Adrenal glands are unremarkable. Kidneys are normal, without renal calculi, focal lesion, or hydronephrosis. Bladder is unremarkable. Stomach/Bowel: A large hiatal hernia is noted. The appendix appears normal. There is colonic diverticulosis without evidence of diverticulitis. No bowel wall thickening, inflammatory changes, or bowel obstruction. Vascular/Lymphatic: Aortic atherosclerosis. No  enlarged abdominal or pelvic lymph nodes. Reproductive: Uterus and bilateral adnexa are unremarkable. Other: Perihepatic fluid is noted. There is no abdominal wall hernia. Musculoskeletal: Degenerative changes are seen in the spine. 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 IMPRESSION: 1. Mild ground-glass opacities in the left lower lung may reflect atelectasis or infection. 2. Large hiatal hernia. 3. Perihepatic fluid of unknown etiology. Aortic Atherosclerosis (ICD10-I70.0). Electronically Signed   By: Romona Curls M.D.   On: 07/25/2022 14:56    Procedures Procedures    Medications Ordered in ED Medications  fentaNYL (SUBLIMAZE) 50 MCG/ML injection (  Not Given 07/25/22 1306)  fentaNYL (SUBLIMAZE) injection 50 mcg (50 mcg Intravenous Given 07/25/22 1301)  lactated ringers bolus 1,000 mL (0 mLs Intravenous Stopped 07/25/22 1411)  lactated ringers bolus 1,000 mL (0 mLs Intravenous Stopped 07/25/22 1542)  HYDROmorphone (DILAUDID) injection 0.5 mg (0.5 mg Intravenous Given 07/25/22 1444)  metoCLOPramide (REGLAN) injection 10 mg (10 mg Intravenous Given 07/25/22 1444)    ED Course/ Medical Decision Making/ A&P                             Medical Decision Making Amount and/or Complexity of Data Reviewed Labs: ordered. Radiology: ordered.  Risk Prescription drug management.   This patient presents to the ED for concern of abdominal pain, nausea, vomiting, this involves an extensive number of treatment options, and is a complaint that carries with it a high risk of complications and morbidity.  The differential diagnosis includes enteritis, pyelonephritis, nephrolithiasis, pneumonia, dehydration, metabolic derangements   Co morbidities that complicate the patient evaluation  HTN, HLD, T2DM, migraine headaches, IBS, arthritis, depression, fibromyalgia   Additional history obtained:  Additional history obtained from N/A External records from  outside source obtained and reviewed including EMR   Lab Tests:  I Ordered, and personally interpreted labs.  The pertinent results include: Slight leukocytosis and lactic acidosis; baseline creatinine.   Imaging Studies ordered:  I ordered imaging studies including CT of abdomen and pelvis I independently visualized and interpreted imaging which showed mild groundglass opacities in left lower lung consistent with atelectasis and/or infection I agree with the radiologist interpretation   Cardiac Monitoring: / EKG:  The patient was maintained on a cardiac monitor.  I personally viewed and interpreted the cardiac monitored which  showed an underlying rhythm of: Sinus rhythm  Problem List / ED Course / Critical interventions / Medication management  Patient presents for worsening symptoms of flank pain, nausea, vomiting, and p.o. intolerance over the past 4 days.  On arrival, patient's heart rate is mildly elevated in the range of 100.  She is normotensive.  She is alert and oriented.  She received Zofran prior to arrival and does state that her nausea has resolved.  On exam, her abdomen is soft.  She does endorse a generalized tenderness.  Current pain is currently maximal in left flank area.  She does have left CVA tenderness.  She denies any recent dysuria, fevers, or chills.  Also on exam, patient has purple toes on both feet with delayed cap refill.  She states that this is a chronic intermittent thing for her.  Given her p.o. intolerance and fluid losses, IV fluids were ordered.  Fentanyl was ordered for analgesia.  Workup was initiated.  Lab work notable for slight leukocytosis and lactic acidosis.  Additional IV fluids were ordered.  While in the ED, patient had recurrence of pain and nausea.  Dilaudid and Reglan were ordered.  CT imaging shows possible left lower lobe pneumonia.  This does correlate with her symptoms.  Antibiotics were deferred pending urinalysis.  Care of patient was  signed out to oncoming ED provider. I ordered medication including IV fluids for hydration; fentanyl and Dilaudid for analgesia; Reglan for nausea Reevaluation of the patient after these medicines showed that the patient improved I have reviewed the patients home medicines and have made adjustments as needed   Social Determinants of Health:  Has PCP        Final Clinical Impression(s) / ED Diagnoses Final diagnoses:  Left flank pain  Nausea and vomiting, unspecified vomiting type    Rx / DC Orders ED Discharge Orders     None         Gloris Manchester, MD 07/25/22 1550

## 2022-07-25 NOTE — ED Notes (Signed)
Pt ambulated around room with no difficulties

## 2022-07-25 NOTE — ED Notes (Signed)
Pt given prepack of zofran.

## 2022-07-25 NOTE — ED Notes (Signed)
Pt given applesauce and water. 

## 2022-07-25 NOTE — ED Notes (Signed)
Patient transported to CT 

## 2022-07-26 LAB — URINE CULTURE

## 2022-07-28 MED FILL — Ondansetron HCl Tab 4 MG: ORAL | Qty: 4 | Status: AC

## 2022-08-10 ENCOUNTER — Other Ambulatory Visit (HOSPITAL_COMMUNITY): Payer: Self-pay | Admitting: Internal Medicine

## 2022-08-10 DIAGNOSIS — R17 Unspecified jaundice: Secondary | ICD-10-CM

## 2022-08-13 ENCOUNTER — Ambulatory Visit (HOSPITAL_COMMUNITY): Admission: RE | Admit: 2022-08-13 | Payer: Medicare Other | Source: Ambulatory Visit

## 2022-08-19 ENCOUNTER — Telehealth: Payer: Self-pay | Admitting: Internal Medicine

## 2022-08-19 ENCOUNTER — Ambulatory Visit: Payer: Medicare Other | Admitting: "Endocrinology

## 2022-08-26 ENCOUNTER — Emergency Department (HOSPITAL_COMMUNITY)
Admission: EM | Admit: 2022-08-26 | Discharge: 2022-08-26 | Disposition: A | Discharge status: Other Acute Inpatient Hospital | Payer: Medicare Other | Attending: Emergency Medicine | Admitting: Emergency Medicine

## 2022-08-26 ENCOUNTER — Emergency Department (HOSPITAL_COMMUNITY): Payer: Medicare Other

## 2022-08-26 ENCOUNTER — Other Ambulatory Visit: Payer: Self-pay

## 2022-08-26 ENCOUNTER — Encounter (HOSPITAL_COMMUNITY): Payer: Self-pay

## 2022-08-26 ENCOUNTER — Ambulatory Visit (INDEPENDENT_AMBULATORY_CARE_PROVIDER_SITE_OTHER): Payer: Medicare Other | Admitting: Internal Medicine

## 2022-08-26 VITALS — BP 74/54 | HR 90 | Ht 63.0 in | Wt 141.2 lb

## 2022-08-26 DIAGNOSIS — D649 Anemia, unspecified: Secondary | ICD-10-CM | POA: Insufficient documentation

## 2022-08-26 DIAGNOSIS — H1589 Other disorders of sclera: Secondary | ICD-10-CM | POA: Insufficient documentation

## 2022-08-26 DIAGNOSIS — I10 Essential (primary) hypertension: Secondary | ICD-10-CM | POA: Insufficient documentation

## 2022-08-26 DIAGNOSIS — E861 Hypovolemia: Secondary | ICD-10-CM | POA: Diagnosis not present

## 2022-08-26 DIAGNOSIS — R188 Other ascites: Secondary | ICD-10-CM | POA: Diagnosis not present

## 2022-08-26 DIAGNOSIS — E119 Type 2 diabetes mellitus without complications: Secondary | ICD-10-CM | POA: Diagnosis not present

## 2022-08-26 DIAGNOSIS — R000 Tachycardia, unspecified: Secondary | ICD-10-CM | POA: Diagnosis not present

## 2022-08-26 DIAGNOSIS — K831 Obstruction of bile duct: Secondary | ICD-10-CM | POA: Diagnosis not present

## 2022-08-26 DIAGNOSIS — Z1152 Encounter for screening for COVID-19: Secondary | ICD-10-CM | POA: Diagnosis not present

## 2022-08-26 DIAGNOSIS — B179 Acute viral hepatitis, unspecified: Secondary | ICD-10-CM | POA: Diagnosis not present

## 2022-08-26 DIAGNOSIS — N289 Disorder of kidney and ureter, unspecified: Secondary | ICD-10-CM | POA: Diagnosis not present

## 2022-08-26 DIAGNOSIS — F988 Other specified behavioral and emotional disorders with onset usually occurring in childhood and adolescence: Secondary | ICD-10-CM | POA: Diagnosis not present

## 2022-08-26 DIAGNOSIS — Z79899 Other long term (current) drug therapy: Secondary | ICD-10-CM | POA: Insufficient documentation

## 2022-08-26 DIAGNOSIS — F332 Major depressive disorder, recurrent severe without psychotic features: Secondary | ICD-10-CM | POA: Diagnosis not present

## 2022-08-26 DIAGNOSIS — F411 Generalized anxiety disorder: Secondary | ICD-10-CM

## 2022-08-26 DIAGNOSIS — I81 Portal vein thrombosis: Secondary | ICD-10-CM | POA: Diagnosis not present

## 2022-08-26 DIAGNOSIS — Z794 Long term (current) use of insulin: Secondary | ICD-10-CM

## 2022-08-26 DIAGNOSIS — E118 Type 2 diabetes mellitus with unspecified complications: Secondary | ICD-10-CM

## 2022-08-26 DIAGNOSIS — R1011 Right upper quadrant pain: Secondary | ICD-10-CM | POA: Diagnosis present

## 2022-08-26 LAB — HEPATITIS PANEL, ACUTE
HCV Ab: NONREACTIVE
Hep A IgM: NONREACTIVE
Hep B C IgM: NONREACTIVE
Hepatitis B Surface Ag: NONREACTIVE

## 2022-08-26 LAB — COMPREHENSIVE METABOLIC PANEL
ALT: 84 U/L — ABNORMAL HIGH (ref 0–44)
AST: 89 U/L — ABNORMAL HIGH (ref 15–41)
Albumin: 2.4 g/dL — ABNORMAL LOW (ref 3.5–5.0)
Alkaline Phosphatase: 527 U/L — ABNORMAL HIGH (ref 38–126)
Anion gap: 13 (ref 5–15)
BUN: 26 mg/dL — ABNORMAL HIGH (ref 8–23)
CO2: 15 mmol/L — ABNORMAL LOW (ref 22–32)
Calcium: 9.4 mg/dL (ref 8.9–10.3)
Chloride: 100 mmol/L (ref 98–111)
Creatinine, Ser: 1.44 mg/dL — ABNORMAL HIGH (ref 0.44–1.00)
GFR, Estimated: 39 mL/min — ABNORMAL LOW (ref >60)
Glucose, Bld: 128 mg/dL — ABNORMAL HIGH (ref 70–99)
Potassium: 3.7 mmol/L (ref 3.5–5.1)
Sodium: 128 mmol/L — ABNORMAL LOW (ref 135–145)
Total Bilirubin: 23.6 mg/dL (ref 0.3–1.2)
Total Protein: 6.2 g/dL — ABNORMAL LOW (ref 6.5–8.1)

## 2022-08-26 LAB — ACETAMINOPHEN LEVEL: Acetaminophen (Tylenol), Serum: <10 ug/mL — ABNORMAL LOW (ref 10–30)

## 2022-08-26 LAB — PROTIME-INR
INR: 1.5 — ABNORMAL HIGH (ref 0.8–1.2)
Prothrombin Time: 18.5 seconds — ABNORMAL HIGH (ref 11.4–15.2)

## 2022-08-26 LAB — CBC WITH DIFFERENTIAL/PLATELET
Abs Immature Granulocytes: 0.08 10*3/uL — ABNORMAL HIGH (ref 0.00–0.07)
Basophils Absolute: 0 10*3/uL (ref 0.0–0.1)
Basophils Relative: 0 %
Eosinophils Absolute: 0 10*3/uL (ref 0.0–0.5)
Eosinophils Relative: 0 %
HCT: 32.4 % — ABNORMAL LOW (ref 36.0–46.0)
Hemoglobin: 11.6 g/dL — ABNORMAL LOW (ref 12.0–15.0)
Immature Granulocytes: 1 %
Lymphocytes Relative: 13 %
Lymphs Abs: 1.3 10*3/uL (ref 0.7–4.0)
MCH: 31.7 pg (ref 26.0–34.0)
MCHC: 35.8 g/dL (ref 30.0–36.0)
MCV: 88.5 fL (ref 80.0–100.0)
Monocytes Absolute: 0.7 10*3/uL (ref 0.1–1.0)
Monocytes Relative: 7 %
Neutro Abs: 7.4 10*3/uL (ref 1.7–7.7)
Neutrophils Relative %: 79 %
Platelets: 500 10*3/uL — ABNORMAL HIGH (ref 150–400)
RBC: 3.66 MIL/uL — ABNORMAL LOW (ref 3.87–5.11)
RDW: 20.7 % — ABNORMAL HIGH (ref 11.5–15.5)
WBC: 9.5 10*3/uL (ref 4.0–10.5)
nRBC: 0 % (ref 0.0–0.2)

## 2022-08-26 LAB — RESP PANEL BY RT-PCR (RSV, FLU A&B, COVID)  RVPGX2
Influenza A by PCR: NEGATIVE
Influenza B by PCR: NEGATIVE
Resp Syncytial Virus by PCR: NEGATIVE
SARS Coronavirus 2 by RT PCR: NEGATIVE

## 2022-08-26 LAB — LACTIC ACID, PLASMA
Lactic Acid, Venous: 2.1 mmol/L (ref 0.5–1.9)
Lactic Acid, Venous: 2 mmol/L (ref 0.5–1.9)

## 2022-08-26 LAB — AMMONIA: Ammonia: 21 umol/L (ref 9–35)

## 2022-08-26 LAB — APTT: aPTT: 28 seconds (ref 24–36)

## 2022-08-26 LAB — CBG MONITORING, ED: Glucose-Capillary: 129 mg/dL — ABNORMAL HIGH (ref 70–99)

## 2022-08-26 MED ORDER — SODIUM CHLORIDE 0.9 % IV BOLUS (SEPSIS)
250.0000 mL | Freq: Once | INTRAVENOUS | Status: AC
  Administered 2022-08-26: 250 mL via INTRAVENOUS

## 2022-08-26 MED ORDER — SODIUM CHLORIDE 0.9 % IV BOLUS (SEPSIS)
500.0000 mL | Freq: Once | INTRAVENOUS | Status: AC
  Administered 2022-08-26: 500 mL via INTRAVENOUS

## 2022-08-26 MED ORDER — SODIUM CHLORIDE 0.9 % IV BOLUS (SEPSIS)
1000.0000 mL | Freq: Once | INTRAVENOUS | Status: AC
  Administered 2022-08-26: 1000 mL via INTRAVENOUS

## 2022-08-26 MED ORDER — LACTATED RINGERS IV SOLN: INTRAVENOUS | Status: DC

## 2022-08-26 MED ORDER — LEVOFLOXACIN IN D5W 750 MG/150ML IV SOLN
750.0000 mg | Freq: Once | INTRAVENOUS | Status: AC
  Administered 2022-08-26: 750 mg via INTRAVENOUS
  Filled 2022-08-26: qty 150

## 2022-08-26 MED ORDER — IOHEXOL 300 MG/ML  SOLN
80.0000 mL | Freq: Once | INTRAMUSCULAR | Status: AC | PRN
  Administered 2022-08-26: 80 mL via INTRAVENOUS

## 2022-08-26 MED ORDER — PHYTONADIONE 5 MG PO TABS
2.5000 mg | ORAL_TABLET | Freq: Once | ORAL | Status: AC
  Administered 2022-08-26: 2.5 mg via ORAL
  Filled 2022-08-26: qty 1

## 2022-08-26 NOTE — ED Notes (Signed)
CT to transfer imaging to accepting facility [facility]

## 2022-08-26 NOTE — Assessment & Plan Note (Signed)
On Adderall Followed by psychiatry-Dr. Ellis Savage

## 2022-08-26 NOTE — ED Notes (Signed)
[facility] in [place], Room [phone], Report [phone]  Per rep. [employer]

## 2022-08-26 NOTE — ED Notes (Signed)
Pt returned from CT °

## 2022-08-26 NOTE — Assessment & Plan Note (Signed)
Has been having vomiting Hypotension likely due to hypovolemia, concern for septic picture considering her hyperbilirubinemia and vomiting Referred to ER for further evaluation - needs IV hydration

## 2022-08-26 NOTE — ED Notes (Signed)
Gastro @ [facility] paged to Dr Hyacinth Meeker through [place] @ [phone]

## 2022-08-26 NOTE — Assessment & Plan Note (Signed)
On Wellbutrin and Abilify On temazepam 22.5 mg at bedtime and Xanax 1 mg 3 times daily as needed Followed by psychiatry

## 2022-08-26 NOTE — Assessment & Plan Note (Signed)
On Wellbutrin and Abilify Followed by psychiatry-Dr. Ellis Savage

## 2022-08-26 NOTE — ED Provider Notes (Signed)
[place] EMERGENCY DEPARTMENT AT [facility] Provider Note   CSN: [id number] Arrival date & time: 08/26/22  1457     History  Chief Complaint  Patient presents with   Jaundice    Sheryl Parker is a 72 y.o. female.  HPI   This patient is a 72 year old female, she has a history of diabetes, she is on insulin, she has hypertension on telmisartan, she was prescribed Jardiance recently but took it only for a few days before it was not making her feel good and she stopped it.  This was prescribed back in April.  She has had some abdominal discomfort and flank pain for which she was actually seen about a month ago, this was nonspecific and the workup was unremarkable.  This includes a CBC, CMP, lipase, lactic acid, there was nothing that was specifically abnormal.  She had a small amount of perihepatic fluid around her liver, she states that she felt better after that visit but in the last several days has gotten worse and started to become jaundiced.  She went for a new patient visit to Dr. Eliane Decree office, she was found to be hypotensive and jaundice and sent immediately to the emergency department.  The patient does appear critically ill, she is weak, she is nauseated, she is losing weight and she has abdominal discomfort mostly right upper and epigastric location.  Home Medications Prior to Admission medications   Medication Sig Start Date End Date Taking? Authorizing Provider  ALPRAZolam Prudy Feeler) 1 MG tablet Take 1 tablet by mouth at bedtime as needed for sleep. 01/31/14  Yes [provider]  amphetamine-dextroamphetamine (ADDERALL) 20 MG tablet Take 20 mg by mouth 2 (two) times daily.   Yes [provider]  insulin glargine (LANTUS SOLOSTAR) 100 UNIT/ML Solostar Pen INJECT 50 UNITS UNDER THE SKIN AT BEDTIME 05/05/22  Yes Nida, Denman George, MD  omeprazole (PRILOSEC) 40 MG capsule Take 40 mg by mouth daily. 07/31/22  Yes [provider]  Continuous  Blood Gluc Sensor (DEXCOM G7 SENSOR) MISC 1 each by Does not apply route.    [provider]  diltiazem (CARDIZEM CD) 120 MG 24 hr capsule Take 120 mg by mouth daily. Patient not taking: Reported on 08/26/2022    [provider]  metoprolol succinate (TOPROL-XL) 25 MG 24 hr tablet Take 25 mg by mouth daily. Patient not taking: Reported on 08/26/2022    [provider]  telmisartan (MICARDIS) 80 MG tablet Take 80 mg by mouth daily. Patient not taking: Reported on 08/26/2022    [provider]      Allergies    Penicillins, Sulfa drugs cross reactors, Codeine, Jardiance [empagliflozin], Levofloxacin, and Prednisone    Review of Systems   Review of Systems  All other systems reviewed and are negative.   Physical Exam Updated Vital Signs BP 101/64   Pulse 77   Temp (!) 97.5 F (36.4 C) (Oral)   Resp 16   Ht 1.6 m (5\' 3" )   Wt 51.7 kg   SpO2 100%   BMI 20.19 kg/m  Physical Exam Vitals and nursing note reviewed.  Constitutional:      General: She is in acute distress.     Appearance: She is well-developed. She is ill-appearing.  HENT:     Head: Normocephalic and atraumatic.     Mouth/Throat:     Mouth: Mucous membranes are dry.     Pharynx: No oropharyngeal exudate.  Eyes:     General:  Scleral icterus present.        Right eye: No discharge.        Left eye: No discharge.     Pupils: Pupils are equal, round, and reactive to light.  Neck:     Thyroid: No thyromegaly.     Vascular: No JVD.  Cardiovascular:     Rate and Rhythm: Regular rhythm. Tachycardia present.     Heart sounds: Normal heart sounds. No murmur heard.    No friction rub. No gallop.  Pulmonary:     Effort: Pulmonary effort is normal. No respiratory distress.     Breath sounds: Normal breath sounds. No wheezing or rales.  Abdominal:     General: Bowel sounds are normal. There is no distension.     Palpations: Abdomen is soft. There is no mass.     Tenderness: There is  abdominal tenderness.     Comments: Focal right upper quadrant and epigastric tenderness but no Murphy sign, no lower abdominal tenderness  Musculoskeletal:        General: No tenderness. Normal range of motion.     Cervical back: Normal range of motion and neck supple.     Right lower leg: No edema.     Left lower leg: No edema.  Lymphadenopathy:     Cervical: No cervical adenopathy.  Skin:    General: Skin is warm and dry.     Coloration: Skin is jaundiced.     Findings: No erythema or rash.  Neurological:     General: No focal deficit present.     Mental Status: She is alert.     Coordination: Coordination normal.  Psychiatric:        Behavior: Behavior normal.     ED Results / Procedures / Treatments   Labs (all labs ordered are listed, but only abnormal results are displayed) Labs Reviewed  COMPREHENSIVE METABOLIC PANEL - Abnormal; Notable for the following components:      Result Value   Sodium 128 (*)    CO2 15 (*)    Glucose, Bld 128 (*)    BUN 26 (*)    Creatinine, Ser 1.44 (*)    Total Protein 6.2 (*)    Albumin 2.4 (*)    AST 89 (*)    ALT 84 (*)    Alkaline Phosphatase 527 (*)    Total Bilirubin 23.6 (*)    GFR, Estimated 39 (*)    All other components within normal limits  LACTIC ACID, PLASMA - Abnormal; Notable for the following components:   Lactic Acid, Venous 2.1 (*)    All other components within normal limits  LACTIC ACID, PLASMA - Abnormal; Notable for the following components:   Lactic Acid, Venous 2.0 (*)    All other components within normal limits  CBC WITH DIFFERENTIAL/PLATELET - Abnormal; Notable for the following components:   RBC 3.66 (*)    Hemoglobin 11.6 (*)    HCT 32.4 (*)    RDW 20.7 (*)    Platelets 500 (*)    Abs Immature Granulocytes 0.08 (*)    All other components within normal limits  PROTIME-INR - Abnormal; Notable for the following components:   Prothrombin Time 18.5 (*)    INR 1.5 (*)    All other components within  normal limits  ACETAMINOPHEN LEVEL - Abnormal; Notable for the following components:   Acetaminophen (Tylenol), Serum <10 (*)    All other components within normal limits  CBG MONITORING, ED - Abnormal; Notable  for the following components:   Glucose-Capillary 129 (*)    All other components within normal limits  RESP PANEL BY RT-PCR (RSV, FLU A&B, COVID)  RVPGX2  CULTURE, BLOOD (ROUTINE X 2)  CULTURE, BLOOD (ROUTINE X 2)  AMMONIA  APTT  URINALYSIS, W/ REFLEX TO CULTURE (INFECTION SUSPECTED)  HEPATITIS PANEL, ACUTE    EKG EKG Interpretation Date/Time:  Wednesday August 26 2022 15:23:34 EDT Ventricular Rate:  73 PR Interval:  135 QRS Duration:  92 QT Interval:  456 QTC Calculation: 503 R Axis:   56  Text Interpretation: Sinus rhythm Borderline T wave abnormalities Prolonged QT interval Since last tracing rate slower Confirmed by Eber Hong ([id number]) on 08/26/2022 3:27:35 PM  Radiology CT ABDOMEN PELVIS W CONTRAST  Result Date: 08/26/2022 CLINICAL DATA:  Epigastric pain with jaundice EXAM: CT ABDOMEN AND PELVIS WITH CONTRAST TECHNIQUE: Multidetector CT imaging of the abdomen and pelvis was performed using the standard protocol following bolus administration of intravenous contrast. RADIATION DOSE REDUCTION: This exam was performed according to the departmental dose-optimization program which includes automated exposure control, adjustment of the mA and/or kV according to patient size and/or use of iterative reconstruction technique. CONTRAST:  80mL OMNIPAQUE IOHEXOL 300 MG/ML  SOLN COMPARISON:  CT renal stone 07/25/2022 FINDINGS: Lower chest: There are trace bilateral pleural effusions. Hepatobiliary: There is moderate intrahepatic biliary ductal dilatation predominantly in the left lobe of the liver with some questionable periportal edema. Pancreas: Unremarkable. No pancreatic ductal dilatation or surrounding inflammatory changes. Spleen: Normal in size without focal abnormality.  Adrenals/Urinary Tract: Adrenal glands are unremarkable. Kidneys are normal, without renal calculi, focal lesion, or hydronephrosis. Bladder is unremarkable. Stomach/Bowel: Colon is decompressed and diffuse colonic wall thickening is not excluded. Colonic diverticula are present. The appendix is within normal limits. There is a moderate-sized hiatal hernia. The stomach is nondilated. Small bowel loops are within normal limits. Vascular/Lymphatic: Aorta and IVC are normal in size. There are atherosclerotic calcifications of the aorta. There is abnormal soft tissue in the region of the porta hepatis measuring 2.9 x 2.2 by 2.5 cm.There is also likely thrombus of the left portal vein at this level. Right portal vein, main portal vein appear patent. Additionally, the splenic vein is not well opacified on this study. The gallbladder appears within normal limits. Reproductive: Uterus and bilateral adnexa are unremarkable. Other: There is new large volume ascites. 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 There is severe degenerative change at this level with 6 mm of anterolisthesis. No focal osseous lesions. IMPRESSION: 1. New large volume ascites. 2. New intrahepatic biliary ductal dilatation predominantly in the left lobe of the liver with questionable periportal edema and left portal vein thrombus. Additionally, there is abnormal soft tissue in the region of the porta hepatis. Findings are worrisome for cholangiocarcinoma. 3. Splenic vein not well opacified on this study. Thrombus not excluded. 4. Diffuse colonic wall thickening worrisome for colitis. 5. Trace bilateral pleural effusions. 6. Moderate-sized hiatal hernia. Aortic Atherosclerosis (ICD10-I70.0). Electronically Signed   By: Darliss Cheney M.D.   On: 08/26/2022 19:48   US Abdomen Complete  Result Date: 08/26/2022 CLINICAL DATA:  Sepsis.  Jaundice EXAM: ABDOMEN ULTRASOUND COMPLETE COMPARISON:  Renal stone CT 07/25/2022.  FINDINGS: Gallbladder: Distended gallbladder. No shadowing stones. There is wall thickening measuring up to 6 mm but there is ascites. There is also an echogenic 4 mm focus along the gallbladder. No shadowing. Possible small polyp Common bile duct: Diameter: 5 mm Liver: Biliary ductal dilatation identified. Nodular  contours of the liver. Portal vein is patent on color Doppler imaging with normal direction of blood flow towards the liver. IVC: No abnormality visualized. Pancreas: Obscured by bowel gas and soft tissue. Spleen: Size and appearance within normal limits. Right Kidney: Length: 9.9 cm. Echogenicity within normal limits. No mass or hydronephrosis visualized. Left Kidney: Length: 8.7 cm. Echogenicity within normal limits. No mass or hydronephrosis visualized. Abdominal aorta: No aneurysm visualized. Other findings: Diffuse ascites. IMPRESSION: Diffuse ascites. Slightly nodular liver with some intrahepatic biliary ductal dilatation. Gallbladder is also distended with wall thickening, nonspecific. 4 mm gallbladder polyp.  No shadowing stones. Based on the overall findings would recommend further evaluation with a contrast CT to further delineate if there is no known history Electronically Signed   By: Karen Kays M.D.   On: 08/26/2022 17:07   DG Chest Port 1 View  Result Date: 08/26/2022 CLINICAL DATA:  Sepsis.  Jaundice. EXAM: PORTABLE CHEST 1 VIEW COMPARISON:  March 06, 2011. FINDINGS: The heart size and mediastinal contours are within normal limits. Moderate size hiatal hernia is noted. Both lungs are clear. The visualized skeletal structures are unremarkable. IMPRESSION: No active disease.  Hiatal hernia. Electronically Signed   By: Lupita Raider M.D.   On: 08/26/2022 16:32    Procedures .Critical Care  Performed by: Eber Hong, MD Authorized by: Eber Hong, MD   Critical care provider statement:    Critical care time (minutes):  45   Critical care time was exclusive of:  Separately  billable procedures and treating other patients and teaching time   Critical care was necessary to treat or prevent imminent or life-threatening deterioration of the following conditions:  Renal failure and hepatic failure   Critical care was time spent personally by me on the following activities:  Ordering and performing treatments and interventions, ordering and review of laboratory studies, ordering and review of radiographic studies, pulse oximetry, re-evaluation of patient's condition, review of old charts, obtaining history from patient or surrogate, examination of patient, evaluation of patient's response to treatment, discussions with consultants and development of treatment plan with patient or surrogate   I assumed direction of critical care for this patient from another provider in my specialty: no     Care discussed with: admitting provider and accepting provider at another facility   Comments:           Medications Ordered in ED Medications  lactated ringers infusion ( Intravenous New Bag/Given 08/26/22 1807)  sodium chloride 0.9 % bolus 1,000 mL (0 mLs Intravenous Stopped 08/26/22 1713)    And  sodium chloride 0.9 % bolus 500 mL (0 mLs Intravenous Stopped 08/26/22 1713)    And  sodium chloride 0.9 % bolus 250 mL (0 mLs Intravenous Stopped 08/26/22 1803)  levofloxacin (LEVAQUIN) IVPB 750 mg (0 mg Intravenous Stopped 08/26/22 1710)  iohexol (OMNIPAQUE) 300 MG/ML solution 80 mL (80 mLs Intravenous Contrast Given 08/26/22 1849)  phytonadione (VITAMIN K) tablet 2.5 mg (2.5 mg Oral Given 08/26/22 2134)    ED Course/ Medical Decision Making/ A&P                                 Medical Decision Making Amount and/or Complexity of Data Reviewed Labs: ordered. Radiology: ordered. ECG/medicine tests: ordered.  Risk Prescription drug management. Decision regarding hospitalization.    This patient presents to the ED for concern of weakness jaundice abdominal discomfort, this  involves an extensive  number of treatment options, and is a complaint that carries with it a high risk of complications and morbidity.  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  The pertinent results include: Severe elevation in bilirubin up to 23, there is thrombocytosis, mild anemia, mild renal insufficiency which is not new, lactate is 2, ammonia is unremarkable   Imaging Studies ordered:  I ordered imaging studies including CT scan of the abdomen and pelvis as well as ultrasound of the abdomen and pelvis I independently visualized and interpreted imaging which showed ascites, biliary obstruction, concern for cholangiocarcinoma I agree with the radiologist interpretation   Cardiac Monitoring: / EKG:  The patient was maintained on a cardiac monitor.  I personally viewed and interpreted the cardiac monitored which showed an underlying rhythm of: Sinus rhythm   Consultations Obtained:  I requested consultation with the gastroenterologist Dr. Jena Gauss who recommends that the patient be transferred if there is a biliary obstruction, can stay here if there is not, then paged hospitalist and discussed lab and imaging findings as well as pertinent plan - they recommend: Admission D/w Dr. Donnal Debar at [facility] - agrees with transfer if bed available  (9:15 PM)   Problem List / ED Course / Critical interventions / Medication management  This patient has  unfortunately what appears to be a possible cholangiocarcinoma colic causing a biliary obstruction.  She will need to be evaluated and possibly have an ERCP, I will discuss the case with gastroenterology at the excepting facility as well as with local hospitalist for transfer.  The patient is not tachycardic or febrile, she continues to be generally ill-appearing with what appears to be new onset liver failure, known renal insufficiency, critically ill I ordered medication including antibiotics for possible abdominal infection Reevaluation of the patient after these medicines showed that the patient critically ill I have reviewed the patients home medicines and have made adjustments as needed   Social Determinants of Health:  Unfortunately critically ill   Test / Admission - Considered:  Admit high level of care         Final Clinical Impression(s) / ED Diagnoses Final diagnoses:  Biliary obstruction  Other ascites  Acute hepatitis  Renal insufficiency  Portal vein thrombosis    Rx / DC Orders ED Discharge Orders     None         Eber Hong, MD 08/26/22 2224

## 2022-08-26 NOTE — Assessment & Plan Note (Deleted)
Lab Results  Component Value Date   HGBA1C 7.0 04/16/2022   Overall well-controlled On Lantus 50 units nightly Was recently placed on Jardiance, but had UTI and severe vomiting with it, has stopped taking it Advised to follow diabetic diet On statin - held it due to concern for hyperbilirubinemia F/u CMP and lipid panel Diabetic eye exam: Advised to follow up with Ophthalmology for diabetic eye exam

## 2022-08-26 NOTE — Progress Notes (Signed)
Elink following sepsis bundle. °

## 2022-08-26 NOTE — Assessment & Plan Note (Signed)
Lab Results  Component Value Date   HGBA1C 7.0 04/16/2022   Overall well-controlled Associated with HTN and HLD On Lantus 50 units nightly Was recently placed on Jardiance, but had UTI and severe vomiting with it, has stopped taking it Advised to follow diabetic diet On statin - held it due to concern for hyperbilirubinemia F/u CMP and lipid panel Diabetic eye exam: Advised to follow up with Ophthalmology for diabetic eye exam

## 2022-08-26 NOTE — Progress Notes (Signed)
New Patient Office Visit  Subjective:  Patient ID: Sheryl Parker, female    DOB: 1950/01/20  Age: 72 y.o. MRN: [id number]  CC:  Chief Complaint  Patient presents with   Establish Care    HPI Sheryl Parker is a 72 y.o. female with past medical history of HTN, type II DM, MDD, GAD and ADD who presents for establishing care.  Hypotension: Her blood pressure was low today despite she has stopped taking telmisartan for the last few weeks.  She also reports vomiting for the last few weeks.  She appears icteric.  She also reports clay colored stool and dark urine for the last 2 weeks.  She had been evaluated by her previous PCP and was told to get [place] of abdomen in outpatient setting, which has not been done yet.  Type II DM: She has stopped taking Jardiance due to recent UTI.  She usually takes Lantus 50 units at bedtime, but had to decrease dose recently due to poor p.o. intake and hypoglycemia.  MDD, GAD and ADD: She takes Wellbutrin, Abilify and as needed Xanax for MDD and GAD.  Followed by psychiatry.  She has not been able to take her medications due to severe nausea and vomiting.  Denies any SI or HI currently.     Past Medical History:  Diagnosis Date   Arthritis    Attention deficit    Dr. Misty Stanley [place]   Chronic back pain    hx chronic narcotics- Dr in [place] recent  meds since 2 yrs ago   Complication of anesthesia    Diabetes mellitus without complication (HCC)    Disc degeneration    Diverticulitis    Fibromyalgia    Hypertension    IBS (irritable bowel syndrome)    Migraine    Osteoporosis    PONV (postoperative nausea and vomiting)    Raynaud disease     Past Surgical History:  Procedure Laterality Date   I & D EXTREMITY Left 03/23/2014   Procedure: IRRIGATION AND DEBRIDEMENT LEFT FINGER;  Surgeon: Bradly Bienenstock, MD;  Location: [facility];  Service: Orthopedics;  Laterality: Left;   OOPHORECTOMY  2002   right   TUBAL LIGATION      Family  History  Problem Relation Age of Onset   Hypertension Mother    Hyperlipidemia Mother    Hypertension Father    Hyperlipidemia Father    Heart attack Father    Colon polyps Brother    Cirrhosis Brother        etoh   Breast cancer Sister    Liver disease Son        Hepatitis C    Social History   Socioeconomic History   Marital status: Married    Spouse name: Not on file   Number of children: 1   Years of education: Not on file   Highest education level: Bachelor's degree (e.g., BA, AB, BS)  Occupational History   Occupation: Surveyor, mining  Tobacco Use   Smoking status: Former    Current packs/day: 0.00    Average packs/day: 0.2 packs/day for 8.0 years (1.6 ttl pk-yrs)    Types: Cigarettes    Start date: 01/05/2010    Quit date: 01/05/2018    Years since quitting: 4.6   Smokeless tobacco: Never   Tobacco comments:    1-3 cigarettes  Substance and Sexual Activity   Alcohol use: No    Comment: HEAVY ETOH X 25 YRS, 8 YRS AGO  Drug use: No   Sexual activity: Not on file  Other Topics Concern   Not on file  Social History Narrative   1 SON-Hep C, polysubstance abuse   Social Determinants of Health   Financial Resource Strain: Medium Risk (08/26/2022)   Overall Financial Resource Strain (CARDIA)    Difficulty of Paying Living Expenses: Somewhat hard  Food Insecurity: Patient Declined (08/26/2022)   Hunger Vital Sign    Worried About Running Out of Food in the Last Year: Patient declined    Ran Out of Food in the Last Year: Patient declined  Transportation Needs: Patient Declined (08/26/2022)   PRAPARE - Administrator, Civil Service (Medical): Patient declined    Lack of Transportation (Non-Medical): Patient declined  Physical Activity: Unknown (08/26/2022)   Exercise Vital Sign    Days of Exercise per Week: 0 days    Minutes of Exercise per Session: Not on file  Stress: Stress Concern Present (08/26/2022)   [employer] of Occupational  Health - Occupational Stress Questionnaire    Feeling of Stress : Rather much  Social Connections: Socially Isolated (08/26/2022)   Social Connection and Isolation Panel [NHANES]    Frequency of Communication with Friends and Family: Never    Frequency of Social Gatherings with Friends and Family: Never    Attends Religious Services: Never    Database administrator or Organizations: No    Attends Engineer, structural: Not on file    Marital Status: Married  Intimate Partner Violence: Unknown (04/09/2021)   Received from [employer], [facility]   HITS    Physically Hurt: Not on file    Insult or Talk Down To: Not on file    Threaten Physical Harm: Not on file    Scream or Curse: Not on file    ROS Review of Systems  Constitutional:  Positive for appetite change and fatigue. Negative for chills and fever.  HENT:  Negative for congestion, sinus pressure and sinus pain.   Eyes:  Negative for discharge.       Yellowish eye  Respiratory:  Negative for cough and shortness of breath.   Cardiovascular:  Negative for chest pain and palpitations.  Gastrointestinal:  Positive for nausea and vomiting. Negative for constipation.       Pale stool  Genitourinary:  Negative for dysuria and hematuria.       Dark-colored urine  Musculoskeletal:  Negative for neck pain and neck stiffness.  Skin:  Negative for rash.  Neurological:  Positive for dizziness and weakness.  Psychiatric/Behavioral:  Positive for dysphoric mood and sleep disturbance. Negative for agitation and behavioral problems. The patient is nervous/anxious.     Objective:   Today's Vitals: BP (!) 74/54 (BP Location: Right Arm, Patient Position: Sitting, Cuff Size: Normal)   Pulse 90   Ht 5\' 3"  (1.6 m)   Wt 141 lb 3.2 oz (64 kg)   SpO2 96%   BMI 25.01 kg/m   Physical Exam Vitals reviewed.  Constitutional:      General: She is in acute distress.     Appearance: She is ill-appearing. She is not diaphoretic.   HENT:     Head: Normocephalic and atraumatic.     Comments: Facial icterus    Nose: Nose normal.     Mouth/Throat:     Mouth: Mucous membranes are moist.  Eyes:     General: Scleral icterus present.     Extraocular Movements: Extraocular movements intact.  Cardiovascular:  Rate and Rhythm: Regular rhythm.     Heart sounds: Normal heart sounds. No murmur heard. Pulmonary:     Breath sounds: Normal breath sounds. No wheezing or rales.  Abdominal:     Palpations: Abdomen is soft.     Tenderness: There is abdominal tenderness (Mild, RUQ and epigastric).  Musculoskeletal:     Cervical back: Neck supple. No tenderness.     Right lower leg: No edema.     Left lower leg: No edema.  Skin:    General: Skin is warm.     Findings: No rash.     Comments: Generalized icterus  Neurological:     General: No focal deficit present.     Mental Status: She is alert and oriented to person, place, and time.  Psychiatric:        Mood and Affect: Mood normal.        Behavior: Behavior normal.     Assessment & Plan:   Problem List Items Addressed This Visit       Cardiovascular and Mediastinum   Hypotension due to hypovolemia - Primary    Has been having vomiting Hypotension likely due to hypovolemia, concern for septic picture considering her hyperbilirubinemia and vomiting Referred to ER for further evaluation - needs IV hydration        Endocrine   Type 2 diabetes mellitus with complication, with long-term current use of insulin (HCC) (Chronic)    Lab Results  Component Value Date   HGBA1C 7.0 04/16/2022   Overall well-controlled Associated with HTN and HLD On Lantus 50 units nightly Was recently placed on Jardiance, but had UTI and severe vomiting with it, has stopped taking it Advised to follow diabetic diet On statin - held it due to concern for hyperbilirubinemia F/u CMP and lipid panel Diabetic eye exam: Advised to follow up with Ophthalmology for diabetic eye exam          Other   Attention deficit disorder    On Adderall Followed by psychiatry-Dr. Ellis Savage      Severe episode of recurrent major depressive disorder (HCC)    On Wellbutrin and Abilify Followed by psychiatry-Dr. Ellis Savage      Generalized anxiety disorder    On Wellbutrin and Abilify On temazepam 22.5 mg at bedtime and Xanax 1 mg 3 times daily as needed Followed by psychiatry      Hyperbilirubinemia    Appears icteric Has clay colored stool and dark urine recently Needs CT abdomen to rule out acute cholangitis considering her current symptoms and hypotension Referred to ER-discussed with ER physician       Outpatient Encounter Medications as of 08/26/2022  Medication Sig   ALPRAZolam (XANAX) 1 MG tablet Take 1 tablet by mouth 3 (three) times daily as needed.   amphetamine-dextroamphetamine (ADDERALL) 20 MG tablet Take 20 mg by mouth 2 (two) times daily.   Continuous Blood Gluc Sensor (DEXCOM G7 SENSOR) MISC 1 each by Does not apply route.   insulin glargine (LANTUS SOLOSTAR) 100 UNIT/ML Solostar Pen INJECT 50 UNITS UNDER THE SKIN AT BEDTIME   ARIPiprazole (ABILIFY) 5 MG tablet Take 2.5-5 mg by mouth daily. (Patient not taking: Reported on 08/26/2022)   buPROPion (WELLBUTRIN XL) 300 MG 24 hr tablet Take 300 mg by mouth daily. (Patient not taking: Reported on 08/26/2022)   diltiazem (CARDIZEM CD) 120 MG 24 hr capsule Take 120 mg by mouth daily. (Patient not taking: Reported on 08/26/2022)   empagliflozin (JARDIANCE) 10 MG TABS tablet  Take 1 tablet (10 mg total) by mouth daily before breakfast. (Patient not taking: Reported on 08/26/2022)   metoprolol succinate (TOPROL-XL) 25 MG 24 hr tablet Take 25 mg by mouth daily. (Patient not taking: Reported on 08/26/2022)   Multiple Vitamin (MULITIVITAMIN WITH MINERALS) TABS Take 1 tablet by mouth daily. (Patient not taking: Reported on 08/26/2022)   ondansetron (ZOFRAN) 4 MG tablet Take 1 tablet (4 mg total) by mouth every 8 (eight) hours  as needed for nausea or vomiting. (Patient not taking: Reported on 08/26/2022)   ondansetron (ZOFRAN-ODT) 4 MG disintegrating tablet Take 1 tablet (4 mg total) by mouth every 8 (eight) hours as needed for nausea or vomiting. (Patient not taking: Reported on 08/26/2022)   rosuvastatin (CRESTOR) 10 MG tablet Take 1 tablet (10 mg total) by mouth daily. (Patient not taking: Reported on 08/26/2022)   telmisartan (MICARDIS) 80 MG tablet Take 80 mg by mouth daily. (Patient not taking: Reported on 08/26/2022)   temazepam (RESTORIL) 22.5 MG capsule Take 1 capsule by mouth at bedtime. (Patient not taking: Reported on 08/26/2022)   [DISCONTINUED] APPLE CIDER VINEGAR PO Take 1 tablet by mouth daily. (Patient not taking: Reported on 08/26/2022)   [DISCONTINUED] ondansetron (ZOFRAN) 4 MG tablet Take 1 tablet (4 mg total) by mouth every 8 (eight) hours as needed for nausea or vomiting. (Patient not taking: Reported on 08/26/2022)   [DISCONTINUED] rizatriptan (MAXALT) 10 MG tablet Take 10 mg by mouth as needed. May repeat in 2 hours if needed for migraines. (Patient not taking: Reported on 08/26/2022)   [DISCONTINUED] VYVANSE 70 MG capsule Take 70 mg by mouth every morning. (Patient not taking: Reported on 01/20/2022)   No facility-administered encounter medications on file as of 08/26/2022.    Follow-up: Return in about 2 weeks (around 09/09/2022).   Anabel Halon, MD

## 2022-08-26 NOTE — Patient Instructions (Signed)
You are being advised to go to ER for evaluation of hypotension and hyperbilirubinemia.

## 2022-08-26 NOTE — ED Triage Notes (Signed)
Pt arrives with her husband and son-in-law who report that her PCP sent her over to be evaluated, pt noted to be jaundice. Pt reports generalized weakness worsening for months.

## 2022-08-26 NOTE — Assessment & Plan Note (Signed)
Appears icteric Has clay colored stool and dark urine recently Needs CT abdomen to rule out acute cholangitis considering her current symptoms and hypotension Referred to ER-discussed with ER physician

## 2022-08-31 LAB — CULTURE, BLOOD (ROUTINE X 2)
Culture: NO GROWTH
Culture: NO GROWTH
Special Requests: ADEQUATE
Special Requests: ADEQUATE

## 2022-09-02 ENCOUNTER — Ambulatory Visit (HOSPITAL_COMMUNITY): Admission: RE | Admit: 2022-09-02 | Payer: Medicare Other | Source: Ambulatory Visit

## 2022-09-02 ENCOUNTER — Encounter (HOSPITAL_COMMUNITY): Payer: Self-pay

## 2022-09-08 ENCOUNTER — Encounter: Payer: Self-pay | Admitting: Internal Medicine

## 2022-09-09 ENCOUNTER — Ambulatory Visit: Payer: TRICARE For Life (TFL) | Admitting: Internal Medicine

## 2022-09-09 NOTE — Progress Notes (Signed)
thanks

## 2022-09-15 ENCOUNTER — Ambulatory Visit: Payer: Medicare Other | Admitting: Internal Medicine

## 2022-09-16 ENCOUNTER — Telehealth: Payer: Self-pay

## 2022-09-16 NOTE — Transitions of Care (Post Inpatient/ED Visit) (Signed)
   09/16/2022  Name: Sheryl Parker MRN: [id number] DOB: Sep 30, 1950  Today's TOC FU Call Status:    Attempted to reach the patient regarding the most recent Inpatient/ED visit. Patient is deceased per [facility] notes Follow Up Plan: No further outreach attempts will be made at this time. We have been unable to contact the patient.  Signature Karena Addison, LPN [facility] Nurse Health Advisor Direct Dial [phone]
